# Patient Record
Sex: Female | Born: 1960 | Race: Black or African American | Hispanic: No | Marital: Married | Smoking: Never smoker
Health system: Southern US, Community
[De-identification: ages and names within clinical notes are randomized; demographics above are authoritative.]

## PROBLEM LIST (undated history)

## (undated) DIAGNOSIS — D72819 Decreased white blood cell count, unspecified: Secondary | ICD-10-CM

## (undated) HISTORY — DX: Decreased white blood cell count, unspecified: D72.819

---

## 1993-11-18 HISTORY — PX: OTHER SURGICAL HISTORY: SHX169

## 1998-09-08 ENCOUNTER — Ambulatory Visit: Admission: RE | Admit: 1998-09-08 | Discharge: 1998-09-08 | Payer: Self-pay | Admitting: *Deleted

## 2000-01-07 ENCOUNTER — Other Ambulatory Visit: Admission: RE | Admit: 2000-01-07 | Discharge: 2000-01-07 | Payer: Self-pay | Admitting: Internal Medicine

## 2001-02-16 ENCOUNTER — Other Ambulatory Visit: Admission: RE | Admit: 2001-02-16 | Discharge: 2001-02-16 | Payer: Self-pay | Admitting: Internal Medicine

## 2002-03-08 ENCOUNTER — Other Ambulatory Visit: Admission: RE | Admit: 2002-03-08 | Discharge: 2002-03-08 | Payer: Self-pay | Admitting: Internal Medicine

## 2003-04-20 ENCOUNTER — Encounter: Admission: RE | Admit: 2003-04-20 | Discharge: 2003-04-20 | Payer: Self-pay | Admitting: Internal Medicine

## 2003-04-20 ENCOUNTER — Encounter: Payer: Self-pay | Admitting: Internal Medicine

## 2004-04-23 ENCOUNTER — Encounter: Admission: RE | Admit: 2004-04-23 | Discharge: 2004-04-23 | Payer: Self-pay | Admitting: Internal Medicine

## 2005-05-06 ENCOUNTER — Encounter: Admission: RE | Admit: 2005-05-06 | Discharge: 2005-05-06 | Payer: Self-pay | Admitting: Internal Medicine

## 2006-01-14 ENCOUNTER — Encounter: Admission: RE | Admit: 2006-01-14 | Discharge: 2006-01-14 | Payer: Self-pay | Admitting: Internal Medicine

## 2006-05-07 ENCOUNTER — Encounter: Admission: RE | Admit: 2006-05-07 | Discharge: 2006-05-07 | Payer: Self-pay | Admitting: Internal Medicine

## 2007-05-12 ENCOUNTER — Encounter: Admission: RE | Admit: 2007-05-12 | Discharge: 2007-05-12 | Payer: Self-pay | Admitting: Internal Medicine

## 2008-05-30 ENCOUNTER — Encounter: Admission: RE | Admit: 2008-05-30 | Discharge: 2008-05-30 | Payer: Self-pay | Admitting: Internal Medicine

## 2009-05-27 ENCOUNTER — Emergency Department (HOSPITAL_COMMUNITY): Admission: EM | Admit: 2009-05-27 | Discharge: 2009-05-27 | Payer: Self-pay | Admitting: Family Medicine

## 2009-06-15 ENCOUNTER — Encounter: Admission: RE | Admit: 2009-06-15 | Discharge: 2009-06-15 | Payer: Self-pay | Admitting: Internal Medicine

## 2010-06-28 ENCOUNTER — Encounter: Admission: RE | Admit: 2010-06-28 | Discharge: 2010-06-28 | Payer: Self-pay | Admitting: Internal Medicine

## 2011-05-30 ENCOUNTER — Other Ambulatory Visit: Payer: Self-pay | Admitting: Internal Medicine

## 2011-05-30 DIAGNOSIS — Z1231 Encounter for screening mammogram for malignant neoplasm of breast: Secondary | ICD-10-CM

## 2011-07-10 ENCOUNTER — Ambulatory Visit
Admission: RE | Admit: 2011-07-10 | Discharge: 2011-07-10 | Disposition: A | Discharge status: Home / Self Care | Payer: 59 | Source: Ambulatory Visit | Attending: Internal Medicine | Admitting: Internal Medicine

## 2011-07-10 DIAGNOSIS — Z1231 Encounter for screening mammogram for malignant neoplasm of breast: Secondary | ICD-10-CM

## 2012-06-08 ENCOUNTER — Other Ambulatory Visit: Payer: Self-pay | Admitting: Internal Medicine

## 2012-06-08 DIAGNOSIS — Z1231 Encounter for screening mammogram for malignant neoplasm of breast: Secondary | ICD-10-CM

## 2012-07-14 ENCOUNTER — Ambulatory Visit
Admission: RE | Admit: 2012-07-14 | Discharge: 2012-07-14 | Disposition: A | Discharge status: Home / Self Care | Payer: 59 | Source: Ambulatory Visit | Attending: Internal Medicine | Admitting: Internal Medicine

## 2012-07-14 DIAGNOSIS — Z1231 Encounter for screening mammogram for malignant neoplasm of breast: Secondary | ICD-10-CM

## 2013-06-25 ENCOUNTER — Other Ambulatory Visit: Payer: Self-pay

## 2013-06-25 DIAGNOSIS — Z1231 Encounter for screening mammogram for malignant neoplasm of breast: Secondary | ICD-10-CM

## 2013-07-22 ENCOUNTER — Ambulatory Visit
Admission: RE | Admit: 2013-07-22 | Discharge: 2013-07-22 | Disposition: A | Discharge status: Home / Self Care | Payer: 59 | Source: Ambulatory Visit

## 2013-07-22 DIAGNOSIS — Z1231 Encounter for screening mammogram for malignant neoplasm of breast: Secondary | ICD-10-CM

## 2014-07-07 ENCOUNTER — Other Ambulatory Visit: Payer: Self-pay

## 2014-07-07 DIAGNOSIS — Z1231 Encounter for screening mammogram for malignant neoplasm of breast: Secondary | ICD-10-CM

## 2014-08-04 ENCOUNTER — Ambulatory Visit
Admission: RE | Admit: 2014-08-04 | Discharge: 2014-08-04 | Disposition: A | Discharge status: Home / Self Care | Payer: 59 | Source: Ambulatory Visit

## 2014-08-04 DIAGNOSIS — Z1231 Encounter for screening mammogram for malignant neoplasm of breast: Secondary | ICD-10-CM

## 2014-11-01 LAB — HM COLONOSCOPY

## 2015-07-25 ENCOUNTER — Other Ambulatory Visit: Payer: Self-pay

## 2015-07-25 DIAGNOSIS — Z1231 Encounter for screening mammogram for malignant neoplasm of breast: Secondary | ICD-10-CM

## 2015-08-07 ENCOUNTER — Ambulatory Visit
Admission: RE | Admit: 2015-08-07 | Discharge: 2015-08-07 | Disposition: A | Discharge status: Home / Self Care | Payer: 59 | Source: Ambulatory Visit

## 2015-08-07 DIAGNOSIS — Z1231 Encounter for screening mammogram for malignant neoplasm of breast: Secondary | ICD-10-CM

## 2016-03-11 DIAGNOSIS — H524 Presbyopia: Secondary | ICD-10-CM | POA: Diagnosis not present

## 2016-04-02 DIAGNOSIS — H9312 Tinnitus, left ear: Secondary | ICD-10-CM | POA: Diagnosis not present

## 2016-04-02 DIAGNOSIS — H9042 Sensorineural hearing loss, unilateral, left ear, with unrestricted hearing on the contralateral side: Secondary | ICD-10-CM | POA: Diagnosis not present

## 2016-06-26 MED FILL — FLUOCINOLONE 0.01% SCALP OI: 0.01 | 30 days supply | Qty: 118 | Fill #1

## 2016-07-01 ENCOUNTER — Other Ambulatory Visit: Payer: Self-pay | Admitting: Nurse Practitioner

## 2016-07-01 ENCOUNTER — Other Ambulatory Visit: Payer: Self-pay | Admitting: Internal Medicine

## 2016-07-01 DIAGNOSIS — Z1231 Encounter for screening mammogram for malignant neoplasm of breast: Secondary | ICD-10-CM

## 2016-07-12 ENCOUNTER — Ambulatory Visit: Payer: 59

## 2016-08-12 ENCOUNTER — Ambulatory Visit
Admission: RE | Admit: 2016-08-12 | Discharge: 2016-08-12 | Disposition: A | Discharge status: Home / Self Care | Payer: 59 | Source: Ambulatory Visit | Attending: Internal Medicine | Admitting: Internal Medicine

## 2016-08-12 DIAGNOSIS — Z1231 Encounter for screening mammogram for malignant neoplasm of breast: Secondary | ICD-10-CM | POA: Diagnosis not present

## 2016-09-26 DIAGNOSIS — Z6823 Body mass index (BMI) 23.0-23.9, adult: Secondary | ICD-10-CM | POA: Diagnosis not present

## 2016-09-26 DIAGNOSIS — Z0000 Encounter for general adult medical examination without abnormal findings: Secondary | ICD-10-CM | POA: Diagnosis not present

## 2016-09-26 MED FILL — NYSTATIN 100,000 UNIT/GM PO: 100000 | 30 days supply | Qty: 60 | Fill #0

## 2016-12-23 DIAGNOSIS — H5203 Hypermetropia, bilateral: Secondary | ICD-10-CM | POA: Diagnosis not present

## 2016-12-23 DIAGNOSIS — H524 Presbyopia: Secondary | ICD-10-CM | POA: Diagnosis not present

## 2016-12-23 DIAGNOSIS — H18413 Arcus senilis, bilateral: Secondary | ICD-10-CM | POA: Diagnosis not present

## 2016-12-23 DIAGNOSIS — H1045 Other chronic allergic conjunctivitis: Secondary | ICD-10-CM | POA: Diagnosis not present

## 2016-12-23 DIAGNOSIS — H52221 Regular astigmatism, right eye: Secondary | ICD-10-CM | POA: Diagnosis not present

## 2017-01-07 DIAGNOSIS — K6289 Other specified diseases of anus and rectum: Secondary | ICD-10-CM | POA: Diagnosis not present

## 2017-02-18 DIAGNOSIS — K602 Anal fissure, unspecified: Secondary | ICD-10-CM | POA: Diagnosis not present

## 2017-02-18 DIAGNOSIS — K6289 Other specified diseases of anus and rectum: Secondary | ICD-10-CM | POA: Diagnosis not present

## 2017-03-28 MED FILL — AMOXICILLIN 250 MG/5 ML SUS: 250 | 10 days supply | Qty: 400 | Fill #0

## 2017-07-08 ENCOUNTER — Other Ambulatory Visit: Payer: Self-pay | Admitting: Internal Medicine

## 2017-07-08 DIAGNOSIS — Z1231 Encounter for screening mammogram for malignant neoplasm of breast: Secondary | ICD-10-CM

## 2017-08-18 ENCOUNTER — Ambulatory Visit
Admission: RE | Admit: 2017-08-18 | Discharge: 2017-08-18 | Disposition: A | Discharge status: Home / Self Care | Payer: 59 | Source: Ambulatory Visit | Attending: Internal Medicine | Admitting: Internal Medicine

## 2017-08-18 DIAGNOSIS — Z1231 Encounter for screening mammogram for malignant neoplasm of breast: Secondary | ICD-10-CM | POA: Diagnosis not present

## 2017-09-01 MED FILL — FLUOCINOLONE 0.01% SCALP OI: 0.01 | 25 days supply | Qty: 118 | Fill #0

## 2017-10-29 DIAGNOSIS — K602 Anal fissure, unspecified: Secondary | ICD-10-CM | POA: Diagnosis not present

## 2017-10-30 DIAGNOSIS — Z0000 Encounter for general adult medical examination without abnormal findings: Secondary | ICD-10-CM | POA: Diagnosis not present

## 2018-02-09 DIAGNOSIS — R21 Rash and other nonspecific skin eruption: Secondary | ICD-10-CM | POA: Diagnosis not present

## 2018-02-09 MED FILL — CLOBETASOL 0.05% CREAM: 0.05 | 20 days supply | Qty: 30 | Fill #0

## 2018-03-31 MED FILL — PREVIDENT 5000 BOOSTER PLUS: 1.1 | 30 days supply | Qty: 100 | Fill #0

## 2018-04-04 DIAGNOSIS — H524 Presbyopia: Secondary | ICD-10-CM | POA: Diagnosis not present

## 2018-08-04 ENCOUNTER — Other Ambulatory Visit: Payer: Self-pay | Admitting: Internal Medicine

## 2018-08-04 DIAGNOSIS — Z1231 Encounter for screening mammogram for malignant neoplasm of breast: Secondary | ICD-10-CM

## 2018-09-01 ENCOUNTER — Ambulatory Visit
Admission: RE | Admit: 2018-09-01 | Discharge: 2018-09-01 | Disposition: A | Discharge status: Home / Self Care | Payer: 59 | Source: Ambulatory Visit | Attending: Internal Medicine | Admitting: Internal Medicine

## 2018-09-01 DIAGNOSIS — Z1231 Encounter for screening mammogram for malignant neoplasm of breast: Secondary | ICD-10-CM

## 2018-11-30 ENCOUNTER — Encounter: Payer: Self-pay | Admitting: Internal Medicine

## 2018-11-30 ENCOUNTER — Other Ambulatory Visit (HOSPITAL_COMMUNITY)
Admission: RE | Admit: 2018-11-30 | Discharge: 2018-11-30 | Disposition: A | Discharge status: Home / Self Care | Payer: 59 | Source: Ambulatory Visit | Attending: Internal Medicine | Admitting: Internal Medicine

## 2018-11-30 ENCOUNTER — Ambulatory Visit: Payer: 59 | Admitting: Internal Medicine

## 2018-11-30 VITALS — BP 112/74 | HR 76 | Temp 98.0°F | Ht 68.58 in | Wt 144.0 lb

## 2018-11-30 DIAGNOSIS — Z01419 Encounter for gynecological examination (general) (routine) without abnormal findings: Secondary | ICD-10-CM | POA: Diagnosis not present

## 2018-11-30 DIAGNOSIS — Z0000 Encounter for general adult medical examination without abnormal findings: Secondary | ICD-10-CM | POA: Diagnosis not present

## 2018-11-30 DIAGNOSIS — Z86010 Personal history of colonic polyps: Secondary | ICD-10-CM | POA: Insufficient documentation

## 2018-11-30 LAB — POCT URINALYSIS DIPSTICK
Bilirubin, UA: NEGATIVE
GLUCOSE UA: NEGATIVE
KETONES UA: NEGATIVE
LEUKOCYTES UA: NEGATIVE
NITRITE UA: NEGATIVE
PROTEIN UA: NEGATIVE
RBC UA: NEGATIVE
SPEC GRAV UA: 1.01 (ref 1.010–1.025)
Urobilinogen, UA: 0.2 E.U./dL
pH, UA: 7.5 (ref 5.0–8.0)

## 2018-11-30 LAB — CMP14+EGFR
A/G RATIO: 1.6 (ref 1.2–2.2)
ALBUMIN: 4.4 g/dL (ref 3.5–5.5)
ALT: 17 IU/L (ref 0–32)
AST: 21 IU/L (ref 0–40)
Alkaline Phosphatase: 94 IU/L (ref 39–117)
BUN / CREAT RATIO: 8 — AB (ref 9–23)
BUN: 7 mg/dL (ref 6–24)
Bilirubin Total: 0.3 mg/dL (ref 0.0–1.2)
CALCIUM: 9.9 mg/dL (ref 8.7–10.2)
CO2: 26 mmol/L (ref 20–29)
CREATININE: 0.92 mg/dL (ref 0.57–1.00)
Chloride: 102 mmol/L (ref 96–106)
GFR calc non Af Amer: 70 mL/min/{1.73_m2} (ref >59)
GFR, EST AFRICAN AMERICAN: 81 mL/min/{1.73_m2} (ref >59)
Globulin, Total: 2.7 g/dL (ref 1.5–4.5)
Glucose: 77 mg/dL (ref 65–99)
POTASSIUM: 4.4 mmol/L (ref 3.5–5.2)
SODIUM: 141 mmol/L (ref 134–144)
TOTAL PROTEIN: 7.1 g/dL (ref 6.0–8.5)

## 2018-11-30 LAB — CBC
Hematocrit: 40 % (ref 34.0–46.6)
Hemoglobin: 13.5 g/dL (ref 11.1–15.9)
MCH: 30.6 pg (ref 26.6–33.0)
MCHC: 33.8 g/dL (ref 31.5–35.7)
MCV: 91 fL (ref 79–97)
PLATELETS: 238 10*3/uL (ref 150–450)
RBC: 4.41 x10E6/uL (ref 3.77–5.28)
RDW: 11.6 % — ABNORMAL LOW (ref 11.7–15.4)
WBC: 3.6 10*3/uL (ref 3.4–10.8)

## 2018-11-30 LAB — LIPID PANEL
CHOLESTEROL TOTAL: 194 mg/dL (ref 100–199)
Chol/HDL Ratio: 2.5 ratio (ref 0.0–4.4)
HDL: 79 mg/dL (ref >39)
LDL Calculated: 101 mg/dL — ABNORMAL HIGH (ref 0–99)
Triglycerides: 70 mg/dL (ref 0–149)
VLDL Cholesterol Cal: 14 mg/dL (ref 5–40)

## 2018-11-30 LAB — HEMOGLOBIN A1C
ESTIMATED AVERAGE GLUCOSE: 105 mg/dL
HEMOGLOBIN A1C: 5.3 % (ref 4.8–5.6)

## 2018-11-30 NOTE — Progress Notes (Signed)
Subjective:     Patient ID: Donna Ayers , female    DOB: Aug 26, 1961 , 58 y.o.   MRN: [id number]   Chief Complaint  Patient presents with  . Annual Exam    HPI  She is here today for a full physical examination. She would like to have a pap smear today. She reports being up to date with mammograms. She thinks she received her last tetanus shot at work. She has no specific concerns or complaints at this time.     History reviewed. No pertinent past medical history.   Family History  Problem Relation Age of Onset  . Diabetes Mother   . Hypertension Father   . Breast cancer Neg Hx      Current Outpatient Medications:  .  Cholecalciferol (VITAMIN D3) 50 MCG (2000 UT) capsule, Take 2,000 Units by mouth daily., Disp: , Rfl:    No Known Allergies    No LMP recorded. Patient is postmenopausal..  Negative for: breast discharge, breast lump(s), breast pain and breast self exam. Associated symptoms include abnormal vaginal bleeding. Pertinent negatives include abnormal bleeding (hematology), anxiety, decreased libido, depression, difficulty falling sleep, dyspareunia, history of infertility, nocturia, sexual dysfunction, sleep disturbances, urinary incontinence, urinary urgency, vaginal discharge and vaginal itching. Diet regular.The patient states her exercise level is  VIGOROUS.  . The patient's tobacco use is:  Social History   Tobacco Use  Smoking Status Never Smoker  Smokeless Tobacco Never Used  . She has been exposed to passive smoke. The patient's alcohol use is:  Social History   Substance and Sexual Activity  Alcohol Use Never  . Frequency: Never  . Additional information: Last pap 2017, next one scheduled for TODAY. Review of Systems  Constitutional: Negative.   HENT: Negative.   Eyes: Negative.   Respiratory: Negative.   Cardiovascular: Negative.   Gastrointestinal: Negative.   Endocrine: Negative.   Genitourinary: Negative.   Musculoskeletal: Negative.    Skin: Negative.   Allergic/Immunologic: Negative.   Neurological: Negative.   Hematological: Negative.   Psychiatric/Behavioral: Negative.      Today's Vitals   11/30/18 0942  BP: 112/74  Pulse: 76  Temp: 98 F (36.7 C)  TempSrc: Oral  Weight: 144 lb (65.3 kg)  Height: 5' 8.58" (1.742 m)   Body mass index is 21.53 kg/m.   Objective:  Physical Exam Vitals signs and nursing note reviewed. Exam conducted with a chaperone present.  Constitutional:      Appearance: Normal appearance. She is normal weight.  HENT:     Head: Normocephalic and atraumatic.     Right Ear: Tympanic membrane, ear canal and external ear normal.     Left Ear: Tympanic membrane, ear canal and external ear normal.     Nose: Nose normal.     Mouth/Throat:     Mouth: Mucous membranes are moist.     Pharynx: Oropharynx is clear.  Eyes:     Extraocular Movements: Extraocular movements intact.     Conjunctiva/sclera: Conjunctivae normal.     Pupils: Pupils are equal, round, and reactive to light.  Neck:     Musculoskeletal: Normal range of motion.  Cardiovascular:     Rate and Rhythm: Normal rate and regular rhythm.     Pulses: Normal pulses.     Heart sounds: Normal heart sounds.  Pulmonary:     Effort: Pulmonary effort is normal.     Breath sounds: Normal breath sounds.  Chest:     Breasts:  Right: Normal. No swelling, bleeding, inverted nipple, mass or nipple discharge.        Left: Normal. No swelling, bleeding, inverted nipple, mass or nipple discharge.  Abdominal:     General: Abdomen is flat. Bowel sounds are normal.     Palpations: Abdomen is soft.  Genitourinary:    General: Normal vulva.     Exam position: Lithotomy position.     Labia:        Right: No rash or tenderness.        Left: No rash or tenderness.      Vagina: Normal.     Cervix: Normal.     Uterus: Normal.      Adnexa: Right adnexa normal and left adnexa normal.  Musculoskeletal: Normal range of motion.  Skin:     General: Skin is warm and dry.     Capillary Refill: Capillary refill takes less than 2 seconds.  Neurological:     General: No focal deficit present.     Mental Status: She is alert and oriented to person, place, and time.  Psychiatric:        Mood and Affect: Mood normal.        Behavior: Behavior normal.         Assessment And Plan:     1. Routine general medical examination at health care facility  A full exam was performed.  Importance of monthly self breast exams was discussed with the patient. PATIENT HAS BEEN ADVISED TO GET 30-45 MINUTES REGULAR EXERCISE NO LESS THAN FOUR TO FIVE DAYS PER WEEK - BOTH WEIGHTBEARING EXERCISES AND AEROBIC ARE RECOMMENDED.  SHE IS ADVISED TO FOLLOW A HEALTHY DIET WITH AT LEAST SIX FRUITS/VEGGIES PER DAY, DECREASE INTAKE OF RED MEAT, AND TO INCREASE FISH INTAKE TO TWO DAYS PER WEEK.  MEATS/FISH SHOULD NOT BE FRIED, BAKED OR BROILED IS PREFERABLE.  I SUGGEST WEARING SPF 50 SUNSCREEN ON EXPOSED PARTS AND ESPECIALLY WHEN IN THE DIRECT SUNLIGHT FOR AN EXTENDED PERIOD OF TIME.  PLEASE AVOID FAST FOOD RESTAURANTS AND INCREASE YOUR WATER INTAKE.  - CMP14+EGFR - CBC - Lipid panel - Hemoglobin A1c - POCT Urinalysis Dipstick (81002)  2. Cervical smear, as part of routine gynecological examination  Pap smear was performed. She refused rectal exam.  Importance of monthly self breast exams was discussed with the patient.   - Cytology -Pap Smear        Maximino Greenland, MD pap

## 2018-11-30 NOTE — Patient Instructions (Signed)

## 2018-12-01 NOTE — Progress Notes (Signed)
Here are your lab results:  Your liver and kidney function are normal.  Your blood count is normal.  Your LDL, bad cholesterol is 101. Ideally, this should be 100 or less, you are close! Continue with your healthy lifestyle and continue to exercise five days per week.   You are not prediabetic. Please let me know if you have any other questions.   Take care,   RS

## 2018-12-02 LAB — CYTOLOGY - PAP
Diagnosis: NEGATIVE
HPV: NOT DETECTED

## 2018-12-18 ENCOUNTER — Ambulatory Visit: Payer: 59

## 2018-12-18 VITALS — BP 112/70 | HR 67 | Temp 98.0°F | Ht 68.5 in

## 2018-12-18 DIAGNOSIS — Z23 Encounter for immunization: Secondary | ICD-10-CM

## 2018-12-18 MED ORDER — TETANUS-DIPHTH-ACELL PERTUSSIS 5-2.5-18.5 LF-MCG/0.5 IM SUSP
0.5000 mL | Freq: Once | INTRAMUSCULAR | Status: AC
  Administered 2018-12-18: 0.5 mL via INTRAMUSCULAR

## 2019-03-08 ENCOUNTER — Other Ambulatory Visit: Payer: Self-pay | Admitting: Internal Medicine

## 2019-03-08 ENCOUNTER — Other Ambulatory Visit: Payer: Self-pay

## 2019-03-08 ENCOUNTER — Encounter: Payer: Self-pay | Admitting: Nurse Practitioner

## 2019-03-08 ENCOUNTER — Ambulatory Visit: Payer: 59 | Admitting: Nurse Practitioner

## 2019-03-08 VITALS — BP 128/70 | HR 75 | Temp 98.3°F | Ht 66.2 in | Wt 144.2 lb

## 2019-03-08 DIAGNOSIS — R591 Generalized enlarged lymph nodes: Secondary | ICD-10-CM | POA: Insufficient documentation

## 2019-03-08 MED ORDER — AMOXICILLIN 400 MG/5ML PO SUSR: ORAL | 0 refills | Status: DC

## 2019-03-08 MED ORDER — AMOXICILLIN 875 MG PO TABS: 875.0000 mg | ORAL_TABLET | Freq: Two times a day (BID) | ORAL | 0 refills | Status: DC

## 2019-03-08 MED FILL — AMOXICILLIN 400 MG/5 ML SUS: 400 | 5 days supply | Qty: 100 | Fill #0

## 2019-03-08 MED FILL — FLUOCINOLONE ACETONIDE SCAL: 0.01 | 30 days supply | Qty: 118 | Fill #0

## 2019-03-08 NOTE — Progress Notes (Signed)
  Subjective:     Patient ID: Donna Ayers , female    DOB: May 07, 1961 , 58 y.o.   MRN: [id number]   Chief Complaint  Patient presents with  . Cyst    HPI  Bump behind left ear - has been present for 11 days.  Denies fever.  Denies pain.  She took cough medication to see if effective.  No antihistamines.      No past medical history on file.   Family History  Problem Relation Age of Onset  . Diabetes Mother   . Hypertension Father   . Breast cancer Neg Hx      Current Outpatient Medications:  .  Cholecalciferol (VITAMIN D3) 50 MCG (2000 UT) capsule, Take 2,000 Units by mouth daily., Disp: , Rfl:  .  Multiple Vitamins-Minerals (MULTIVITAL) CHEW, Chew by mouth., Disp: , Rfl:    No Known Allergies   Review of Systems  Constitutional: Negative for fatigue and fever.  HENT: Negative.  Negative for sore throat.   Musculoskeletal: Negative for neck pain.       Knot below left ear.  Neurological: Negative.  Negative for dizziness and headaches.  Psychiatric/Behavioral: Negative.      Today's Vitals   03/08/19 0849  BP: 128/70  Pulse: 75  Temp: 98.3 F (36.8 C)  TempSrc: Oral  Weight: 144 lb 3.2 oz (65.4 kg)  Height: 5' 6.2" (1.681 m)   Body mass index is 23.13 kg/m.   Objective:  Physical Exam Constitutional:      Appearance: Normal appearance.  HENT:     Head: Normocephalic.     Right Ear: Tympanic membrane, ear canal and external ear normal. There is no impacted cerumen.     Left Ear: Tympanic membrane, ear canal and external ear normal. There is no impacted cerumen.     Mouth/Throat:     Palate: Mass (left submandibular area) present. No lesions.  Eyes:     Pupils: Pupils are equal, round, and reactive to light.  Cardiovascular:     Rate and Rhythm: Normal rate and regular rhythm.     Pulses: Normal pulses.     Heart sounds: No murmur.  Skin:    Capillary Refill: Capillary refill takes less than 2 seconds.  Neurological:     General: No focal  deficit present.     Mental Status: She is alert and oriented to person, place, and time.  Psychiatric:        Mood and Affect: Mood normal.        Behavior: Behavior normal.        Thought Content: Thought content normal.        Judgment: Judgment normal.         Assessment And Plan:     1. Lymphadenopathy  Firm mass to left subclavicular area nontender  Will treat with antibiotic to see if improves  Also will check neck ultrasound to evaluate mass  Lymphadenopathy of unknown origin vs viral vs seasonal allergies - CBC with Diff - HIV antibody (with reflex) - US Soft Tissue Head/Neck; Future - amoxicillin (AMOXIL) 400 MG/5ML suspension; Take 10 ml by mouth BID  Dispense: 100 mL; Refill: 0      Arnette Felts, FNP    THE PATIENT IS ENCOURAGED TO PRACTICE SOCIAL DISTANCING DUE TO THE COVID-19 PANDEMIC.

## 2019-03-09 LAB — CBC WITH DIFFERENTIAL/PLATELET
Basophils Absolute: 0 10*3/uL (ref 0.0–0.2)
Basos: 0 %
EOS (ABSOLUTE): 0 10*3/uL (ref 0.0–0.4)
Eos: 1 %
Hematocrit: 42.8 % (ref 34.0–46.6)
Hemoglobin: 13.7 g/dL (ref 11.1–15.9)
Immature Grans (Abs): 0 10*3/uL (ref 0.0–0.1)
Immature Granulocytes: 0 %
Lymphocytes Absolute: 1.3 10*3/uL (ref 0.7–3.1)
Lymphs: 47 %
MCH: 29.7 pg (ref 26.6–33.0)
MCHC: 32 g/dL (ref 31.5–35.7)
MCV: 93 fL (ref 79–97)
Monocytes Absolute: 0.3 10*3/uL (ref 0.1–0.9)
Monocytes: 12 %
Neutrophils Absolute: 1.1 10*3/uL — ABNORMAL LOW (ref 1.4–7.0)
Neutrophils: 40 %
Platelets: 238 10*3/uL (ref 150–450)
RBC: 4.61 x10E6/uL (ref 3.77–5.28)
RDW: 12 % (ref 11.7–15.4)
WBC: 2.7 10*3/uL — ABNORMAL LOW (ref 3.4–10.8)

## 2019-03-09 LAB — HIV ANTIBODY (ROUTINE TESTING W REFLEX): HIV Screen 4th Generation wRfx: NONREACTIVE

## 2019-03-11 ENCOUNTER — Telehealth: Payer: Self-pay

## 2019-03-11 NOTE — Telephone Encounter (Signed)
1st attempt to give lab results  

## 2019-03-11 NOTE — Telephone Encounter (Signed)
-----   Message from Arnette Felts, FNP sent at 03/10/2019  5:32 PM EDT ----- Your white count is slightly low indicative you may have a virus.  Have you been for the ultrasound? Is the area any better?

## 2019-03-30 ENCOUNTER — Other Ambulatory Visit: Payer: Self-pay | Admitting: Nurse Practitioner

## 2019-03-30 ENCOUNTER — Other Ambulatory Visit: Payer: 59

## 2019-03-30 ENCOUNTER — Ambulatory Visit
Admission: RE | Admit: 2019-03-30 | Discharge: 2019-03-30 | Disposition: A | Discharge status: Home / Self Care | Payer: 59 | Source: Ambulatory Visit | Attending: Nurse Practitioner | Admitting: Nurse Practitioner

## 2019-03-30 DIAGNOSIS — R591 Generalized enlarged lymph nodes: Secondary | ICD-10-CM | POA: Diagnosis not present

## 2019-03-30 DIAGNOSIS — L049 Acute lymphadenitis, unspecified: Secondary | ICD-10-CM

## 2019-03-31 NOTE — Progress Notes (Signed)
Yes, they will call her

## 2019-04-13 ENCOUNTER — Ambulatory Visit
Admission: RE | Admit: 2019-04-13 | Discharge: 2019-04-13 | Disposition: A | Discharge status: Home / Self Care | Payer: 59 | Source: Ambulatory Visit | Attending: Nurse Practitioner | Admitting: Nurse Practitioner

## 2019-04-13 DIAGNOSIS — L049 Acute lymphadenitis, unspecified: Secondary | ICD-10-CM | POA: Diagnosis not present

## 2019-04-13 MED ORDER — IOPAMIDOL (ISOVUE-300) INJECTION 61%
75.0000 mL | Freq: Once | INTRAVENOUS | Status: AC | PRN
  Administered 2019-04-13: 12:00:00 75 mL via INTRAVENOUS

## 2019-04-16 ENCOUNTER — Telehealth: Payer: Self-pay

## 2019-04-16 NOTE — Telephone Encounter (Signed)
I called pt to see what time would be best for Arnette Felts FNP-BC to call her regarding her results pt stated she goes to lunch at 12:30 I notified her Lolita Cram will be contacting her at 12:45 [facility]

## 2019-04-26 ENCOUNTER — Other Ambulatory Visit: Payer: Self-pay | Admitting: Nurse Practitioner

## 2019-04-26 DIAGNOSIS — R591 Generalized enlarged lymph nodes: Secondary | ICD-10-CM

## 2019-05-27 ENCOUNTER — Telehealth: Payer: Self-pay

## 2019-05-27 NOTE — Telephone Encounter (Signed)
Patient called stating she still hasnt received a call from the ENT specialist. I have left pt v/m to notify her that they should be contacting her. YRL,RMA

## 2019-06-08 DIAGNOSIS — R591 Generalized enlarged lymph nodes: Secondary | ICD-10-CM | POA: Diagnosis not present

## 2019-06-14 ENCOUNTER — Other Ambulatory Visit: Payer: Self-pay | Admitting: Otolaryngology

## 2019-06-14 DIAGNOSIS — R591 Generalized enlarged lymph nodes: Secondary | ICD-10-CM

## 2019-06-16 ENCOUNTER — Other Ambulatory Visit: Payer: Self-pay | Admitting: Otolaryngology

## 2019-06-16 DIAGNOSIS — R591 Generalized enlarged lymph nodes: Secondary | ICD-10-CM

## 2019-07-06 ENCOUNTER — Ambulatory Visit
Admission: RE | Admit: 2019-07-06 | Discharge: 2019-07-06 | Disposition: A | Discharge status: Home / Self Care | Payer: 59 | Source: Ambulatory Visit | Attending: Otolaryngology | Admitting: Otolaryngology

## 2019-07-06 DIAGNOSIS — R591 Generalized enlarged lymph nodes: Secondary | ICD-10-CM

## 2019-07-06 DIAGNOSIS — R590 Localized enlarged lymph nodes: Secondary | ICD-10-CM | POA: Diagnosis not present

## 2019-07-20 ENCOUNTER — Other Ambulatory Visit: Payer: Self-pay | Admitting: Internal Medicine

## 2019-07-20 DIAGNOSIS — Z1231 Encounter for screening mammogram for malignant neoplasm of breast: Secondary | ICD-10-CM

## 2019-08-17 DIAGNOSIS — H524 Presbyopia: Secondary | ICD-10-CM | POA: Diagnosis not present

## 2019-09-14 ENCOUNTER — Ambulatory Visit
Admission: RE | Admit: 2019-09-14 | Discharge: 2019-09-14 | Disposition: A | Discharge status: Home / Self Care | Payer: 59 | Source: Ambulatory Visit | Attending: Internal Medicine | Admitting: Internal Medicine

## 2019-09-14 ENCOUNTER — Other Ambulatory Visit: Payer: Self-pay

## 2019-09-14 DIAGNOSIS — Z1231 Encounter for screening mammogram for malignant neoplasm of breast: Secondary | ICD-10-CM | POA: Diagnosis not present

## 2019-12-07 ENCOUNTER — Ambulatory Visit: Payer: 59 | Admitting: Internal Medicine

## 2019-12-07 ENCOUNTER — Encounter: Payer: Self-pay | Admitting: Internal Medicine

## 2019-12-07 ENCOUNTER — Other Ambulatory Visit: Payer: Self-pay

## 2019-12-07 VITALS — BP 112/70 | HR 74 | Temp 98.3°F | Ht 66.2 in | Wt 150.8 lb

## 2019-12-07 DIAGNOSIS — R635 Abnormal weight gain: Secondary | ICD-10-CM | POA: Diagnosis not present

## 2019-12-07 DIAGNOSIS — Z0000 Encounter for general adult medical examination without abnormal findings: Secondary | ICD-10-CM | POA: Diagnosis not present

## 2019-12-07 DIAGNOSIS — Z780 Asymptomatic menopausal state: Secondary | ICD-10-CM

## 2019-12-07 LAB — POCT URINALYSIS DIPSTICK
Bilirubin, UA: NEGATIVE
Glucose, UA: NEGATIVE
Ketones, UA: NEGATIVE
Leukocytes, UA: NEGATIVE
Nitrite, UA: NEGATIVE
Protein, UA: NEGATIVE
Spec Grav, UA: 1.015 (ref 1.010–1.025)
Urobilinogen, UA: 0.2 E.U./dL
pH, UA: 7 (ref 5.0–8.0)

## 2019-12-07 NOTE — Patient Instructions (Signed)
Health Maintenance, Female Adopting a healthy lifestyle and getting preventive care are important in promoting health and wellness. Ask your health care provider about:  The right schedule for you to have regular tests and exams.  Things you can do on your own to prevent diseases and keep yourself healthy. What should I know about diet, weight, and exercise? Eat a healthy diet   Eat a diet that includes plenty of vegetables, fruits, low-fat dairy products, and lean protein.  Do not eat a lot of foods that are high in solid fats, added sugars, or sodium. Maintain a healthy weight Body mass index (BMI) is used to identify weight problems. It estimates body fat based on height and weight. Your health care provider can help determine your BMI and help you achieve or maintain a healthy weight. Get regular exercise Get regular exercise. This is one of the most important things you can do for your health. Most adults should:  Exercise for at least 150 minutes each week. The exercise should increase your heart rate and make you sweat (moderate-intensity exercise).  Do strengthening exercises at least twice a week. This is in addition to the moderate-intensity exercise.  Spend less time sitting. Even light physical activity can be beneficial. Watch cholesterol and blood lipids Have your blood tested for lipids and cholesterol at 57 years of age, then have this test every 5 years. Have your cholesterol levels checked more often if:  Your lipid or cholesterol levels are high.  You are older than 57 years of age.  You are at high risk for heart disease. What should I know about cancer screening? Depending on your health history and family history, you may need to have cancer screening at various ages. This may include screening for:  Breast cancer.  Cervical cancer.  Colorectal cancer.  Skin cancer.  Lung cancer. What should I know about heart disease, diabetes, and high blood  pressure? Blood pressure and heart disease  High blood pressure causes heart disease and increases the risk of stroke. This is more likely to develop in people who have high blood pressure readings, are of African descent, or are overweight.  Have your blood pressure checked: ? Every 3-5 years if you are 18-39 years of age. ? Every year if you are 40 years old or older. Diabetes Have regular diabetes screenings. This checks your fasting blood sugar level. Have the screening done:  Once every three years after age 40 if you are at a normal weight and have a low risk for diabetes.  More often and at a younger age if you are overweight or have a high risk for diabetes. What should I know about preventing infection? Hepatitis B If you have a higher risk for hepatitis B, you should be screened for this virus. Talk with your health care provider to find out if you are at risk for hepatitis B infection. Hepatitis C Testing is recommended for:  Everyone born from 1945 through 1965.  Anyone with known risk factors for hepatitis C. Sexually transmitted infections (STIs)  Get screened for STIs, including gonorrhea and chlamydia, if: ? You are sexually active and are younger than 57 years of age. ? You are older than 57 years of age and your health care provider tells you that you are at risk for this type of infection. ? Your sexual activity has changed since you were last screened, and you are at increased risk for chlamydia or gonorrhea. Ask your health care provider if   you are at risk.  Ask your health care provider about whether you are at high risk for HIV. Your health care provider may recommend a prescription medicine to help prevent HIV infection. If you choose to take medicine to prevent HIV, you should first get tested for HIV. You should then be tested every 3 months for as long as you are taking the medicine. Pregnancy  If you are about to stop having your period (premenopausal) and  you may become pregnant, seek counseling before you get pregnant.  Take 400 to 800 micrograms (mcg) of folic acid every day if you become pregnant.  Ask for birth control (contraception) if you want to prevent pregnancy. Osteoporosis and menopause Osteoporosis is a disease in which the bones lose minerals and strength with aging. This can result in bone fractures. If you are 65 years old or older, or if you are at risk for osteoporosis and fractures, ask your health care provider if you should:  Be screened for bone loss.  Take a calcium or vitamin D supplement to lower your risk of fractures.  Be given hormone replacement therapy (HRT) to treat symptoms of menopause. Follow these instructions at home: Lifestyle  Do not use any products that contain nicotine or tobacco, such as cigarettes, e-cigarettes, and chewing tobacco. If you need help quitting, ask your health care provider.  Do not use street drugs.  Do not share needles.  Ask your health care provider for help if you need support or information about quitting drugs. Alcohol use  Do not drink alcohol if: ? Your health care provider tells you not to drink. ? You are pregnant, may be pregnant, or are planning to become pregnant.  If you drink alcohol: ? Limit how much you use to 0-1 drink a day. ? Limit intake if you are breastfeeding.  Be aware of how much alcohol is in your drink. In the [place] one drink equals one 12 oz bottle of beer (355 mL), one 5 oz glass of wine (148 mL), or one 1 oz glass of hard liquor (44 mL). General instructions  Schedule regular health, dental, and eye exams.  Stay current with your vaccines.  Tell your health care provider if: ? You often feel depressed. ? You have ever been abused or do not feel safe at home. Summary  Adopting a healthy lifestyle and getting preventive care are important in promoting health and wellness.  Follow your health care provider's instructions about healthy  diet, exercising, and getting tested or screened for diseases.  Follow your health care provider's instructions on monitoring your cholesterol and blood pressure. This information is not intended to replace advice given to you by your health care provider. Make sure you discuss any questions you have with your health care provider. Document Revised: 10/28/2018 Document Reviewed: 10/28/2018 Elsevier Patient Education  2020 [employer]  

## 2019-12-07 NOTE — Addendum Note (Signed)
Addended by: Mariam Dollar on: 12/07/2019 12:20 PM   Modules accepted: Orders

## 2019-12-07 NOTE — Progress Notes (Signed)
This visit occurred during the SARS-CoV-2 public health emergency.  Safety protocols were in place, including screening questions prior to the visit, additional usage of staff PPE, and extensive cleaning of exam room while observing appropriate contact time as indicated for disinfecting solutions.  Subjective:     Patient ID: Donna Ayers , female    DOB: 1961-08-14 , 59 y.o.   MRN: [id number]   Chief Complaint  Patient presents with  . Annual Exam    HPI  She is here today for a full physical exam. She is not followed by GYN. She had pap smear performed last year and it was normal, HPV negative.     History reviewed. No pertinent past medical history.   Family History  Problem Relation Age of Onset  . Diabetes Mother   . Hypertension Father   . Breast cancer Neg Hx      Current Outpatient Medications:  .  Cholecalciferol (VITAMIN D3) 50 MCG (2000 UT) capsule, Take 2,000 Units by mouth daily., Disp: , Rfl:  .  Fluocinolone Acetonide Scalp 0.01 % OIL, APPLY TO SCALP AFTER SHAMPOO, Disp: 118.28 mL, Rfl: 5 .  Multiple Vitamins-Minerals (MULTIVITAL) CHEW, Chew by mouth., Disp: , Rfl:    No Known Allergies   Review of Systems  Constitutional: Negative.        She inquired about her weight today. Feels she has gained weight in hips/thighs. She is still exercising regularly. She prefers cardio over weights.   HENT: Negative.   Eyes: Negative.   Respiratory: Negative.   Cardiovascular: Negative.   Endocrine: Negative.   Genitourinary: Negative.        She wants to be checked for menopause. She reports her last cycle was five years ago. She wants to know if menopause is causing her to gain weight.   Musculoskeletal: Negative.   Skin: Negative.   Allergic/Immunologic: Negative.   Neurological: Negative.   Hematological: Negative.   Psychiatric/Behavioral: Negative.      Today's Vitals   12/07/19 0911  BP: 112/70  Pulse: 74  Temp: 98.3 F (36.8 C)  TempSrc: Oral   Weight: 150 lb 12.8 oz (68.4 kg)  Height: 5' 6.2" (1.681 m)   Body mass index is 24.19 kg/m.   Objective:  Physical Exam Vitals and nursing note reviewed.  Constitutional:      Appearance: Normal appearance.  HENT:     Head: Normocephalic and atraumatic.     Right Ear: Tympanic membrane, ear canal and external ear normal.     Left Ear: Tympanic membrane, ear canal and external ear normal.     Nose:     Comments: Deferred, masked    Mouth/Throat:     Comments: Deferred,masked Eyes:     Extraocular Movements: Extraocular movements intact.     Conjunctiva/sclera: Conjunctivae normal.     Pupils: Pupils are equal, round, and reactive to light.  Cardiovascular:     Rate and Rhythm: Normal rate and regular rhythm.     Pulses: Normal pulses.     Heart sounds: Normal heart sounds.  Pulmonary:     Effort: Pulmonary effort is normal.     Breath sounds: Normal breath sounds.  Chest:     Breasts: Tanner Score is 5.        Right: Normal.        Left: Normal.  Abdominal:     General: Bowel sounds are normal.     Palpations: Abdomen is soft.  Genitourinary:    Comments:  deferred Musculoskeletal:        General: Normal range of motion.     Cervical back: Normal range of motion and neck supple.  Skin:    General: Skin is warm and dry.  Neurological:     General: No focal deficit present.     Mental Status: She is alert and oriented to person, place, and time.  Psychiatric:        Mood and Affect: Mood normal.        Behavior: Behavior normal.         Assessment And Plan:     1. Routine general medical examination at health care facility  A full exam was performed.  She is encouraged to perform monthly self breast exams. I did review her colonoscopy report from 76. PATIENT IS ADVISED TO GET 30-45 MINUTES REGULAR EXERCISE NO LESS THAN FOUR TO FIVE DAYS PER WEEK - BOTH WEIGHTBEARING EXERCISES AND AEROBIC ARE RECOMMENDED.  SHE IS ADVISED TO FOLLOW A HEALTHY DIET WITH AT LEAST  SIX FRUITS/VEGGIES PER DAY, DECREASE INTAKE OF RED MEAT, AND TO INCREASE FISH INTAKE TO TWO DAYS PER WEEK.  MEATS/FISH SHOULD NOT BE FRIED, BAKED OR BROILED IS PREFERABLE.  I SUGGEST WEARING SPF 50 SUNSCREEN ON EXPOSED PARTS AND ESPECIALLY WHEN IN THE DIRECT SUNLIGHT FOR AN EXTENDED PERIOD OF TIME.  PLEASE AVOID FAST FOOD RESTAURANTS AND INCREASE YOUR WATER INTAKE.  - CMP14+EGFR - CBC - Lipid panel - TSH - FSH  2. Weight gain  Wt Readings from Last 3 Encounters:  12/07/19 150 lb 12.8 oz (68.4 kg)  03/08/19 144 lb 3.2 oz (65.4 kg)  11/30/18 144 lb (65.3 kg)   She is aware of weight gain. She is encouraged to incorporate more strength training into her workout routine. She is reminded that this will help maintain her bone health as well.   3. Postmenopausal  Pt advised that she is definitely postmenopausal. However, I will check [facility] today as requested.      Maximino Greenland, MD    THE PATIENT IS ENCOURAGED TO PRACTICE SOCIAL DISTANCING DUE TO THE COVID-19 PANDEMIC.

## 2019-12-13 ENCOUNTER — Telehealth: Payer: Self-pay

## 2019-12-13 NOTE — Telephone Encounter (Signed)
The pt was asked when she could return to do her blood work that wasn't done the day of her visit.

## 2019-12-14 DIAGNOSIS — Z0000 Encounter for general adult medical examination without abnormal findings: Secondary | ICD-10-CM | POA: Diagnosis not present

## 2019-12-15 ENCOUNTER — Encounter: Payer: Self-pay | Admitting: Internal Medicine

## 2019-12-15 LAB — LIPID PANEL
Chol/HDL Ratio: 2.6 ratio (ref 0.0–4.4)
Cholesterol, Total: 166 mg/dL (ref 100–199)
HDL: 64 mg/dL (ref >39)
LDL Chol Calc (NIH): 92 mg/dL (ref 0–99)
Triglycerides: 51 mg/dL (ref 0–149)
VLDL Cholesterol Cal: 10 mg/dL (ref 5–40)

## 2019-12-15 LAB — CMP14+EGFR
ALT: 16 IU/L (ref 0–32)
AST: 22 IU/L (ref 0–40)
Albumin/Globulin Ratio: 1.3 (ref 1.2–2.2)
Albumin: 4.1 g/dL (ref 3.8–4.9)
Alkaline Phosphatase: 96 IU/L (ref 39–117)
BUN/Creatinine Ratio: 8 — ABNORMAL LOW (ref 9–23)
BUN: 6 mg/dL (ref 6–24)
Bilirubin Total: 0.3 mg/dL (ref 0.0–1.2)
CO2: 27 mmol/L (ref 20–29)
Calcium: 9.6 mg/dL (ref 8.7–10.2)
Chloride: 103 mmol/L (ref 96–106)
Creatinine, Ser: 0.75 mg/dL (ref 0.57–1.00)
GFR calc Af Amer: 103 mL/min/{1.73_m2} (ref >59)
GFR calc non Af Amer: 89 mL/min/{1.73_m2} (ref >59)
Globulin, Total: 3.1 g/dL (ref 1.5–4.5)
Glucose: 91 mg/dL (ref 65–99)
Potassium: 4.3 mmol/L (ref 3.5–5.2)
Sodium: 140 mmol/L (ref 134–144)
Total Protein: 7.2 g/dL (ref 6.0–8.5)

## 2019-12-15 LAB — CBC
Hematocrit: 39.2 % (ref 34.0–46.6)
Hemoglobin: 13.4 g/dL (ref 11.1–15.9)
MCH: 30.9 pg (ref 26.6–33.0)
MCHC: 34.2 g/dL (ref 31.5–35.7)
MCV: 90 fL (ref 79–97)
Platelets: 214 10*3/uL (ref 150–450)
RBC: 4.34 x10E6/uL (ref 3.77–5.28)
RDW: 11.5 % — ABNORMAL LOW (ref 11.7–15.4)
WBC: 3.3 10*3/uL — ABNORMAL LOW (ref 3.4–10.8)

## 2019-12-15 LAB — FOLLICLE STIMULATING HORMONE: FSH: 123 m[IU]/mL

## 2019-12-15 LAB — TSH: TSH: 1.85 u[IU]/mL (ref 0.450–4.500)

## 2019-12-27 ENCOUNTER — Telehealth: Payer: Self-pay

## 2019-12-27 NOTE — Telephone Encounter (Signed)
The pt wanted to know what she can do to get her WBC count up because it was low.  The pt was told that Dr. Allyne Gee said that a low WBC count isn't unusual in african americans and that the pt can have it rechecked every 3 months.

## 2020-07-20 ENCOUNTER — Encounter: Payer: Self-pay | Admitting: Nurse Practitioner

## 2020-07-20 ENCOUNTER — Other Ambulatory Visit: Payer: Self-pay

## 2020-07-20 ENCOUNTER — Ambulatory Visit: Payer: 59 | Admitting: Nurse Practitioner

## 2020-07-20 VITALS — BP 110/78 | HR 90 | Temp 98.4°F

## 2020-07-20 DIAGNOSIS — M25532 Pain in left wrist: Secondary | ICD-10-CM

## 2020-07-20 MED ORDER — DICLOFENAC SODIUM 1 % EX GEL: 2.0000 g | Freq: Four times a day (QID) | CUTANEOUS | 2 refills | Status: DC

## 2020-07-20 MED ORDER — TRIAMCINOLONE ACETONIDE 40 MG/ML IJ SUSP
60.0000 mg | Freq: Once | INTRAMUSCULAR | Status: AC
  Administered 2020-07-20: 60 mg via INTRAMUSCULAR

## 2020-07-20 MED FILL — DICLOFENAC SODIUM 1 % GEL: 1 | 13 days supply | Qty: 100 | Fill #0

## 2020-07-20 NOTE — Progress Notes (Signed)
I,Yamilka Roman [employer] as a Neurosurgeon for [employer], FNP.,have documented all relevant documentation on the behalf of Arnette Felts, FNP,as directed by  Arnette Felts, FNP while in the presence of Arnette Felts, FNP.  This visit occurred during the SARS-CoV-2 public health emergency.  Safety protocols were in place, including screening questions prior to the visit, additional usage of staff PPE, and extensive cleaning of exam room while observing appropriate contact time as indicated for disinfecting solutions.  Subjective:     Patient ID: Donna Ayers , female    DOB: 06/21/1961 , 59 y.o.   MRN: [id number]   Chief Complaint  Patient presents with  . Hand Pain    Patient stated her left wrist has been hurting her she is unsure if she hit it or not. she reports some swelling     HPI  Wrist Pain  The pain is present in the left hand. This is a new problem. The current episode started more than 1 month ago. History of extremity trauma: unknown. The problem occurs constantly. The problem has been gradually improving. The quality of the pain is described as dull. The pain is at a severity of 3/10. The pain is mild. Associated symptoms include joint swelling. Pertinent negatives include no fever, stiffness or tingling. The symptoms are aggravated by activity (unable to twist wrist). She has tried OTC ointments (epsom salt, ice cream) for the symptoms. Family history does not include gout.     History reviewed. No pertinent past medical history.   Family History  Problem Relation Age of Onset  . Diabetes Mother   . Hypertension Father   . Breast cancer Neg Hx      Current Outpatient Medications:  .  Cholecalciferol (VITAMIN D3) 50 MCG (2000 UT) capsule, Take 2,000 Units by mouth daily., Disp: , Rfl:  .  Fluocinolone Acetonide Scalp 0.01 % OIL, APPLY TO SCALP AFTER SHAMPOO, Disp: 118.28 mL, Rfl: 5 .  Multiple Vitamins-Minerals (MULTIVITAL) CHEW, Chew by mouth., Disp: , Rfl:  .   diclofenac Sodium (VOLTAREN) 1 % GEL, Apply 2 g topically 4 (four) times daily., Disp: 100 g, Rfl: 2   Allergies  Allergen Reactions  . Tramadol Other (See Comments)    vomiting     Review of Systems  Constitutional: Negative.  Negative for fever.  Respiratory: Negative.   Cardiovascular: Negative.   Musculoskeletal: Positive for arthralgias (left hand pain). Negative for stiffness.  Neurological: Negative for tingling.  Psychiatric/Behavioral: Negative.      Today's Vitals   07/20/20 1611  BP: 110/78  Pulse: 90  Temp: 98.4 F (36.9 C)  TempSrc: Oral  PainSc: 0-No pain   There is no height or weight on file to calculate BMI.   Objective:  Physical Exam Constitutional:      Appearance: Normal appearance.  Cardiovascular:     Rate and Rhythm: Normal rate and regular rhythm.  Musculoskeletal:        General: No swelling or tenderness.     Right lower leg: No edema.     Left lower leg: No edema.     Comments: Tenderness to left wrist on palpation, negative phalens  Neurological:     Mental Status: She is alert.  Psychiatric:        Mood and Affect: Mood normal.        Behavior: Behavior normal.        Thought Content: Thought content normal.        Judgment: Judgment normal.  Assessment And Plan:     1. Left wrist pain  If symptoms worsen will check xray  Osteoarthritis vs tenditis - diclofenac Sodium (VOLTAREN) 1 % GEL; Apply 2 g topically 4 (four) times daily.  Dispense: 100 g; Refill: 2 - triamcinolone acetonide (KENALOG-40) injection 60 mg     Patient was given opportunity to ask questions. Patient verbalized understanding of the plan and was able to repeat key elements of the plan. All questions were answered to their satisfaction.  Arnette Felts, FNP   I, Arnette Felts, FNP, have reviewed all documentation for this visit. The documentation on 07/24/20 for the exam, diagnosis, procedures, and orders are all accurate and complete.  THE PATIENT IS  ENCOURAGED TO PRACTICE SOCIAL DISTANCING DUE TO THE COVID-19 PANDEMIC.

## 2020-07-31 ENCOUNTER — Other Ambulatory Visit: Payer: Self-pay | Admitting: Nurse Practitioner

## 2020-07-31 DIAGNOSIS — M25532 Pain in left wrist: Secondary | ICD-10-CM

## 2020-08-02 ENCOUNTER — Telehealth: Payer: Self-pay

## 2020-08-02 ENCOUNTER — Ambulatory Visit
Admission: RE | Admit: 2020-08-02 | Discharge: 2020-08-02 | Disposition: A | Discharge status: Home / Self Care | Payer: 59 | Source: Ambulatory Visit | Attending: Nurse Practitioner | Admitting: Nurse Practitioner

## 2020-08-02 DIAGNOSIS — M25532 Pain in left wrist: Secondary | ICD-10-CM

## 2020-08-02 NOTE — Telephone Encounter (Signed)
LVM for pt to go over to [facility] to have Xray of wrist done

## 2020-08-21 ENCOUNTER — Other Ambulatory Visit: Payer: Self-pay | Admitting: Internal Medicine

## 2020-08-21 DIAGNOSIS — Z1231 Encounter for screening mammogram for malignant neoplasm of breast: Secondary | ICD-10-CM

## 2020-08-30 ENCOUNTER — Other Ambulatory Visit: Payer: Self-pay | Admitting: Internal Medicine

## 2020-08-30 MED FILL — FLUOCINOLONE ACETONIDE SCAL: 0.01 | 30 days supply | Qty: 118 | Fill #0

## 2020-09-18 ENCOUNTER — Ambulatory Visit
Admission: RE | Admit: 2020-09-18 | Discharge: 2020-09-18 | Disposition: A | Discharge status: Home / Self Care | Payer: 59 | Source: Ambulatory Visit | Attending: Internal Medicine | Admitting: Internal Medicine

## 2020-09-18 ENCOUNTER — Other Ambulatory Visit: Payer: Self-pay

## 2020-09-18 DIAGNOSIS — Z1231 Encounter for screening mammogram for malignant neoplasm of breast: Secondary | ICD-10-CM | POA: Diagnosis not present

## 2020-12-12 ENCOUNTER — Other Ambulatory Visit: Payer: Self-pay

## 2020-12-12 ENCOUNTER — Encounter: Payer: Self-pay | Admitting: Internal Medicine

## 2020-12-12 ENCOUNTER — Ambulatory Visit (INDEPENDENT_AMBULATORY_CARE_PROVIDER_SITE_OTHER): Payer: 59 | Admitting: Internal Medicine

## 2020-12-12 VITALS — BP 112/70 | HR 87 | Temp 98.0°F | Ht 66.2 in | Wt 152.0 lb

## 2020-12-12 DIAGNOSIS — L989 Disorder of the skin and subcutaneous tissue, unspecified: Secondary | ICD-10-CM

## 2020-12-12 DIAGNOSIS — Z86010 Personal history of colonic polyps: Secondary | ICD-10-CM

## 2020-12-12 DIAGNOSIS — D72819 Decreased white blood cell count, unspecified: Secondary | ICD-10-CM | POA: Diagnosis not present

## 2020-12-12 DIAGNOSIS — E559 Vitamin D deficiency, unspecified: Secondary | ICD-10-CM

## 2020-12-12 DIAGNOSIS — Z0000 Encounter for general adult medical examination without abnormal findings: Secondary | ICD-10-CM | POA: Diagnosis not present

## 2020-12-12 NOTE — Patient Instructions (Addendum)
Health Maintenance, Female Adopting a healthy lifestyle and getting preventive care are important in promoting health and wellness. Ask your health care provider about:  The right schedule for you to have regular tests and exams.  Things you can do on your own to prevent diseases and keep yourself healthy. What should I know about diet, weight, and exercise? Eat a healthy diet  Eat a diet that includes plenty of vegetables, fruits, low-fat dairy products, and lean protein.  Do not eat a lot of foods that are high in solid fats, added sugars, or sodium.   Maintain a healthy weight Body mass index (BMI) is used to identify weight problems. It estimates body fat based on height and weight. Your health care provider can help determine your BMI and help you achieve or maintain a healthy weight. Get regular exercise Get regular exercise. This is one of the most important things you can do for your health. Most adults should:  Exercise for at least 150 minutes each week. The exercise should increase your heart rate and make you sweat (moderate-intensity exercise).  Do strengthening exercises at least twice a week. This is in addition to the moderate-intensity exercise.  Spend less time sitting. Even light physical activity can be beneficial. Watch cholesterol and blood lipids Have your blood tested for lipids and cholesterol at 58 years of age, then have this test every 5 years. Have your cholesterol levels checked more often if:  Your lipid or cholesterol levels are high.  You are older than 58 years of age.  You are at high risk for heart disease. What should I know about cancer screening? Depending on your health history and family history, you may need to have cancer screening at various ages. This may include screening for:  Breast cancer.  Cervical cancer.  Colorectal cancer.  Skin cancer.  Lung cancer. What should I know about heart disease, diabetes, and high blood  pressure? Blood pressure and heart disease  High blood pressure causes heart disease and increases the risk of stroke. This is more likely to develop in people who have high blood pressure readings, are of African descent, or are overweight.  Have your blood pressure checked: ? Every 3-5 years if you are 18-39 years of age. ? Every year if you are 40 years old or older. Diabetes Have regular diabetes screenings. This checks your fasting blood sugar level. Have the screening done:  Once every three years after age 40 if you are at a normal weight and have a low risk for diabetes.  More often and at a younger age if you are overweight or have a high risk for diabetes. What should I know about preventing infection? Hepatitis B If you have a higher risk for hepatitis B, you should be screened for this virus. Talk with your health care provider to find out if you are at risk for hepatitis B infection. Hepatitis C Testing is recommended for:  Everyone born from 1945 through 1965.  Anyone with known risk factors for hepatitis C. Sexually transmitted infections (STIs)  Get screened for STIs, including gonorrhea and chlamydia, if: ? You are sexually active and are younger than 58 years of age. ? You are older than 58 years of age and your health care provider tells you that you are at risk for this type of infection. ? Your sexual activity has changed since you were last screened, and you are at increased risk for chlamydia or gonorrhea. Ask your health care provider   if you are at risk.  Ask your health care provider about whether you are at high risk for HIV. Your health care provider may recommend a prescription medicine to help prevent HIV infection. If you choose to take medicine to prevent HIV, you should first get tested for HIV. You should then be tested every 3 months for as long as you are taking the medicine. Pregnancy  If you are about to stop having your period (premenopausal) and  you may become pregnant, seek counseling before you get pregnant.  Take 400 to 800 micrograms (mcg) of folic acid every day if you become pregnant.  Ask for birth control (contraception) if you want to prevent pregnancy. Osteoporosis and menopause Osteoporosis is a disease in which the bones lose minerals and strength with aging. This can result in bone fractures. If you are 65 years old or older, or if you are at risk for osteoporosis and fractures, ask your health care provider if you should:  Be screened for bone loss.  Take a calcium or vitamin D supplement to lower your risk of fractures.  Be given hormone replacement therapy (HRT) to treat symptoms of menopause. Follow these instructions at home: Lifestyle  Do not use any products that contain nicotine or tobacco, such as cigarettes, e-cigarettes, and chewing tobacco. If you need help quitting, ask your health care provider.  Do not use street drugs.  Do not share needles.  Ask your health care provider for help if you need support or information about quitting drugs. Alcohol use  Do not drink alcohol if: ? Your health care provider tells you not to drink. ? You are pregnant, may be pregnant, or are planning to become pregnant.  If you drink alcohol: ? Limit how much you use to 0-1 drink a day. ? Limit intake if you are breastfeeding.  Be aware of how much alcohol is in your drink. In the [place] one drink equals one 12 oz bottle of beer (355 mL), one 5 oz glass of wine (148 mL), or one 1 oz glass of hard liquor (44 mL). General instructions  Schedule regular health, dental, and eye exams.  Stay current with your vaccines.  Tell your health care provider if: ? You often feel depressed. ? You have ever been abused or do not feel safe at home. Summary  Adopting a healthy lifestyle and getting preventive care are important in promoting health and wellness.  Follow your health care provider's instructions about healthy  diet, exercising, and getting tested or screened for diseases.  Follow your health care provider's instructions on monitoring your cholesterol and blood pressure. This information is not intended to replace advice given to you by your health care provider. Make sure you discuss any questions you have with your health care provider. Document Revised: 10/28/2018 Document Reviewed: 10/28/2018 Elsevier Patient Education  2021 Elsevier Inc.  

## 2020-12-12 NOTE — Progress Notes (Signed)
I,Katawbba Wiggins,acting as a Education administrator for Maximino Greenland, MD.,have documented all relevant documentation on the behalf of Maximino Greenland, MD,as directed by  Maximino Greenland, MD while in the presence of Maximino Greenland, MD.  This visit occurred during the SARS-CoV-2 public health emergency.  Safety protocols were in place, including screening questions prior to the visit, additional usage of staff PPE, and extensive cleaning of exam room while observing appropriate contact time as indicated for disinfecting solutions.  Subjective:     Patient ID: Gevena Ayers , female    DOB: Sep 29, 1961 , 60 y.o.   MRN: [id number]   Chief Complaint  Patient presents with  . Annual Exam    HPI  She is here today for a full physical exam. She is not followed by GYN. She had pap smear performed 2020 and it was normal, HPV negative. She has no specific concerns or complaints at this time.     Past Medical History:  Diagnosis Date  . Leukopenia      Family History  Problem Relation Age of Onset  . Diabetes Mother   . Hypertension Father   . Breast cancer Neg Hx      Current Outpatient Medications:  .  Cholecalciferol (VITAMIN D3) 50 MCG (2000 UT) capsule, Take 2,000 Units by mouth daily., Disp: , Rfl:  .  diclofenac Sodium (VOLTAREN) 1 % GEL, Apply 2 g topically 4 (four) times daily., Disp: 100 g, Rfl: 2 .  Fluocinolone Acetonide Scalp 0.01 % OIL, APPLY TO SCALP AFTER SHAMPOO, Disp: 118.28 mL, Rfl: 5 .  Multiple Vitamins-Minerals (MULTIVITAL) CHEW, Chew by mouth. 2 gummies 2 times per day, Disp: , Rfl:    Allergies  Allergen Reactions  . Tramadol Other (See Comments)    vomiting      The patient states she uses none for birth control. Last LMP was No LMP recorded. Patient is postmenopausal.. Negative for Dysmenorrhea. Negative for: breast discharge, breast lump(s), breast pain and breast self exam. Associated symptoms include abnormal vaginal bleeding. Pertinent negatives include abnormal  bleeding (hematology), anxiety, decreased libido, depression, difficulty falling sleep, dyspareunia, history of infertility, nocturia, sexual dysfunction, sleep disturbances, urinary incontinence, urinary urgency, vaginal discharge and vaginal itching. Diet regular.The patient states her exercise level is  moderate/srenuous.   . The patient's tobacco use is:  Social History   Tobacco Use  Smoking Status Never Smoker  Smokeless Tobacco Never Used  . She has been exposed to passive smoke. The patient's alcohol use is:  Social History   Substance and Sexual Activity  Alcohol Use Never   Review of Systems  Constitutional: Negative.   HENT: Negative.   Eyes: Negative.   Respiratory: Negative.   Cardiovascular: Negative.   Gastrointestinal: Negative.   Endocrine: Negative.   Genitourinary: Negative.   Musculoskeletal: Negative.   Skin: Negative.        She has lesion on right inner thigh - she first noticed last summer. It has not changed in size/shape. She deines blisters. She denies having similar sx in the past.   Allergic/Immunologic: Negative.   Neurological: Negative.   Hematological: Negative.   Psychiatric/Behavioral: Negative.      Today's Vitals   12/12/20 0927  BP: 112/70  Pulse: 87  Temp: 98 F (36.7 C)  TempSrc: Oral  Weight: 152 lb (68.9 kg)  Height: 5' 6.2" (1.681 m)   Body mass index is 24.39 kg/m.  Wt Readings from Last 3 Encounters:  12/12/20 152 lb (68.9 kg)  12/07/19 150 lb 12.8 oz (68.4 kg)  03/08/19 144 lb 3.2 oz (65.4 kg)   Objective:  Physical Exam Vitals and nursing note reviewed.  Constitutional:      General: She is not in acute distress.    Appearance: Normal appearance. She is well-developed.  HENT:     Head: Normocephalic and atraumatic.     Right Ear: Hearing, tympanic membrane, ear canal and external ear normal. There is no impacted cerumen.     Left Ear: Hearing, tympanic membrane, ear canal and external ear normal. There is no  impacted cerumen.     Nose:     Comments: Deferred, masked    Mouth/Throat:     Comments: Deferred, masked Eyes:     General: Lids are normal.     Extraocular Movements: Extraocular movements intact.     Conjunctiva/sclera: Conjunctivae normal.  Neck:     Thyroid: No thyroid mass.     Vascular: No carotid bruit.  Cardiovascular:     Rate and Rhythm: Normal rate and regular rhythm.     Pulses: Normal pulses.     Heart sounds: Normal heart sounds. No murmur heard.   Pulmonary:     Effort: Pulmonary effort is normal.     Breath sounds: Normal breath sounds.  Chest:  Breasts:     Tanner Score is 5.     Right: Normal.     Left: Normal.    Abdominal:     General: Abdomen is flat. Bowel sounds are normal. There is no distension.     Palpations: Abdomen is soft.     Tenderness: There is no abdominal tenderness.  Genitourinary:    Comments: Deferred Musculoskeletal:        General: No swelling. Normal range of motion.     Cervical back: Full passive range of motion without pain, normal range of motion and neck supple.     Right lower leg: No edema.     Left lower leg: No edema.  Skin:    General: Skin is warm and dry.     Capillary Refill: Capillary refill takes less than 2 seconds.     Comments: Skin lesions, right inner thigh. Slightly scaly. No vesicular lesions noted. No overlying erythema.   Neurological:     General: No focal deficit present.     Mental Status: She is alert and oriented to person, place, and time.     Cranial Nerves: No cranial nerve deficit.     Sensory: No sensory deficit.  Psychiatric:        Mood and Affect: Mood normal.        Behavior: Behavior normal.        Thought Content: Thought content normal.        Judgment: Judgment normal.         Assessment And Plan:     1. Routine general medical examination at health care facility Comments: A full exam was performed. Importance of monthly self breast exams was discussed with the patient.  PATIENT IS ADVISED TO GET 30-45 MINUTES REGULAR EXERCISE NO LESS THAN FOUR TO FIVE DAYS PER WEEK - BOTH WEIGHTBEARING EXERCISES AND AEROBIC ARE RECOMMENDED.  PATIENT IS ADVISED TO FOLLOW A HEALTHY DIET WITH AT LEAST SIX FRUITS/VEGGIES PER DAY, DECREASE INTAKE OF RED MEAT, AND TO INCREASE FISH INTAKE TO TWO DAYS PER WEEK.  MEATS/FISH SHOULD NOT BE FRIED, BAKED OR BROILED IS PREFERABLE.  I SUGGEST WEARING SPF 50 SUNSCREEN ON EXPOSED PARTS AND ESPECIALLY WHEN  IN THE DIRECT SUNLIGHT FOR AN EXTENDED PERIOD OF TIME.  PLEASE AVOID FAST FOOD [employer]URANTS AND INCREASE YOUR WATER INTAKE. c - CMP14+EGFR - Lipid panel - CBC with Diff  2. Skin lesion Comments: I suggested rx triamcinolone cream. She elects to get OTC hydrocortisone cream.   3. Leukopenia, unspecified type Comments: Previous WBC results reviewed. Pt advised most recent WBC has improved. I will recheck today. She would like to see specialist if needed.   4. Vitamin D deficiency disease Comments: I will check vitamin D level and supplement as needed.  - Vitamin D (25 hydroxy) c 5. Personal history of colonic polyps Comments: Her last colonoscopy was in December 2015. I will refer her to GI for CRC screening.  - Ambulatory referral to Gastroenterology  Patient was given opportunity to ask questions. Patient verbalized understanding of the plan and was able to repeat key elements of the plan. All questions were answered to their satisfaction.  Maximino Greenland, MD   I, Maximino Greenland, MD, have reviewed all documentation for this visit. The documentation on 12/13/20 for the exam, diagnosis, procedures, and orders are all accurate and complete.  THE PATIENT IS ENCOURAGED TO PRACTICE SOCIAL DISTANCING DUE TO THE COVID-19 PANDEMIC.

## 2020-12-13 ENCOUNTER — Encounter: Payer: Self-pay | Admitting: Internal Medicine

## 2020-12-13 LAB — VITAMIN D 25 HYDROXY (VIT D DEFICIENCY, FRACTURES): Vit D, 25-Hydroxy: 50.7 ng/mL (ref 30.0–100.0)

## 2020-12-13 LAB — CBC WITH DIFFERENTIAL/PLATELET
Basophils Absolute: 0 10*3/uL (ref 0.0–0.2)
Basos: 1 %
EOS (ABSOLUTE): 0 10*3/uL (ref 0.0–0.4)
Eos: 0 %
Hematocrit: 41.5 % (ref 34.0–46.6)
Hemoglobin: 14 g/dL (ref 11.1–15.9)
Immature Grans (Abs): 0 10*3/uL (ref 0.0–0.1)
Immature Granulocytes: 0 %
Lymphocytes Absolute: 1.6 10*3/uL (ref 0.7–3.1)
Lymphs: 40 %
MCH: 30.3 pg (ref 26.6–33.0)
MCHC: 33.7 g/dL (ref 31.5–35.7)
MCV: 90 fL (ref 79–97)
Monocytes Absolute: 0.3 10*3/uL (ref 0.1–0.9)
Monocytes: 8 %
Neutrophils Absolute: 2.1 10*3/uL (ref 1.4–7.0)
Neutrophils: 51 %
Platelets: 232 10*3/uL (ref 150–450)
RBC: 4.62 x10E6/uL (ref 3.77–5.28)
RDW: 11.1 % — ABNORMAL LOW (ref 11.7–15.4)
WBC: 4.1 10*3/uL (ref 3.4–10.8)

## 2020-12-13 LAB — CMP14+EGFR
ALT: 17 IU/L (ref 0–32)
AST: 21 IU/L (ref 0–40)
Albumin/Globulin Ratio: 1.5 (ref 1.2–2.2)
Albumin: 4.6 g/dL (ref 3.8–4.9)
Alkaline Phosphatase: 99 IU/L (ref 44–121)
BUN/Creatinine Ratio: 10 (ref 9–23)
BUN: 8 mg/dL (ref 6–24)
Bilirubin Total: 0.4 mg/dL (ref 0.0–1.2)
CO2: 24 mmol/L (ref 20–29)
Calcium: 10.2 mg/dL (ref 8.7–10.2)
Chloride: 103 mmol/L (ref 96–106)
Creatinine, Ser: 0.78 mg/dL (ref 0.57–1.00)
GFR calc Af Amer: 98 mL/min/{1.73_m2} (ref >59)
GFR calc non Af Amer: 85 mL/min/{1.73_m2} (ref >59)
Globulin, Total: 3 g/dL (ref 1.5–4.5)
Glucose: 90 mg/dL (ref 65–99)
Potassium: 4.9 mmol/L (ref 3.5–5.2)
Sodium: 142 mmol/L (ref 134–144)
Total Protein: 7.6 g/dL (ref 6.0–8.5)

## 2020-12-13 LAB — LIPID PANEL
Chol/HDL Ratio: 2.7 ratio (ref 0.0–4.4)
Cholesterol, Total: 207 mg/dL — ABNORMAL HIGH (ref 100–199)
HDL: 78 mg/dL (ref >39)
LDL Chol Calc (NIH): 120 mg/dL — ABNORMAL HIGH (ref 0–99)
Triglycerides: 52 mg/dL (ref 0–149)
VLDL Cholesterol Cal: 9 mg/dL (ref 5–40)

## 2021-03-26 DIAGNOSIS — Z20822 Contact with and (suspected) exposure to covid-19: Secondary | ICD-10-CM | POA: Diagnosis not present

## 2021-04-06 DIAGNOSIS — Z20822 Contact with and (suspected) exposure to covid-19: Secondary | ICD-10-CM | POA: Diagnosis not present

## 2021-04-20 DIAGNOSIS — Z20822 Contact with and (suspected) exposure to covid-19: Secondary | ICD-10-CM | POA: Diagnosis not present

## 2021-05-03 DIAGNOSIS — Z20822 Contact with and (suspected) exposure to covid-19: Secondary | ICD-10-CM | POA: Diagnosis not present

## 2021-05-18 DIAGNOSIS — Z20822 Contact with and (suspected) exposure to covid-19: Secondary | ICD-10-CM | POA: Diagnosis not present

## 2021-05-31 DIAGNOSIS — Z20822 Contact with and (suspected) exposure to covid-19: Secondary | ICD-10-CM | POA: Diagnosis not present

## 2021-06-15 DIAGNOSIS — Z20822 Contact with and (suspected) exposure to covid-19: Secondary | ICD-10-CM | POA: Diagnosis not present

## 2021-07-23 IMAGING — US US THYROID
1 series · 9 of 9 positions shown · non-contrast
Comparison: CT neck 04/13/2019 and neck ultrasound 03/30/2019.

CLINICAL DATA: Left neck lymph node.

EXAM:
ULTRASOUND OF HEAD/NECK SOFT TISSUES
TECHNIQUE: Ultrasound examination of the head and neck soft tissues was
performed in the area of clinical concern.

[Series 1: us thyroid · 0.05mm/px · 9 of 9 slices shown]
[im 1/9]
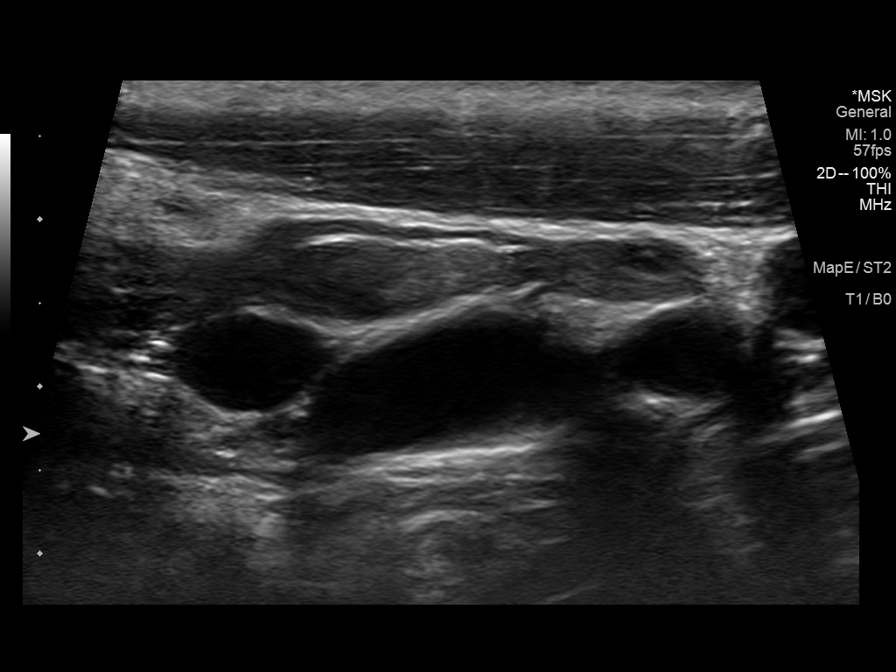
[im 2/9]
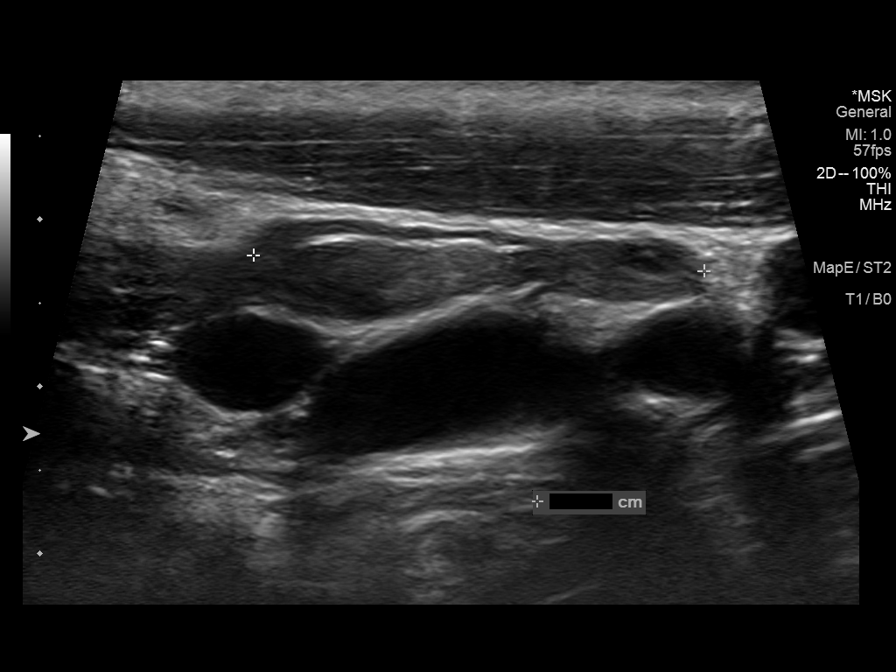
[im 3/9]
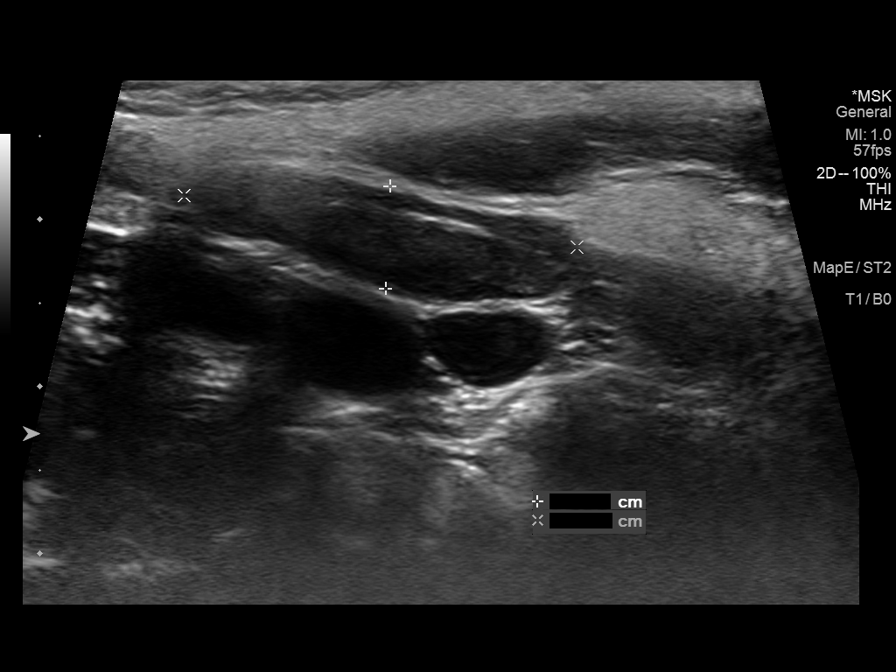
[im 4/9]
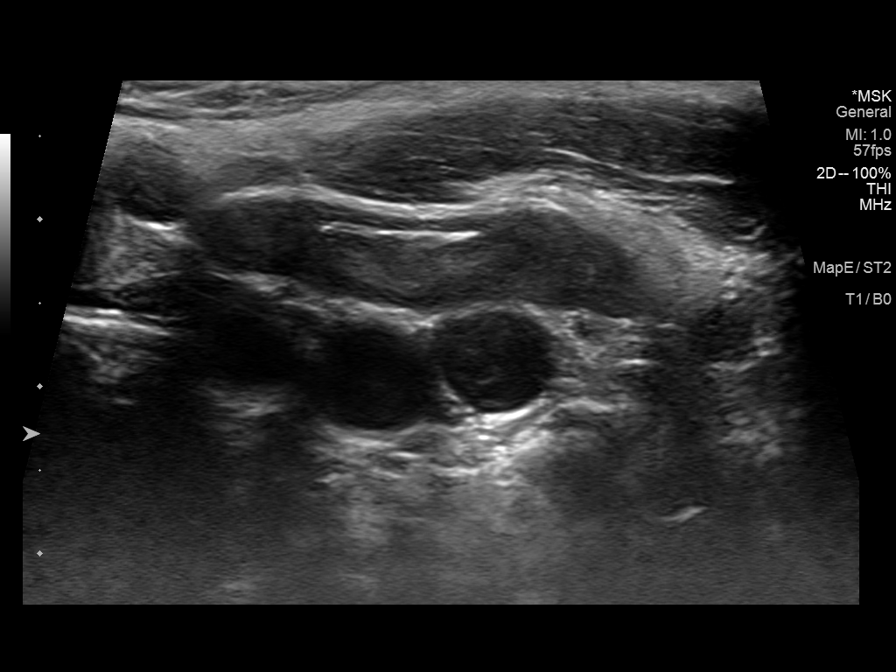
[im 5/9]
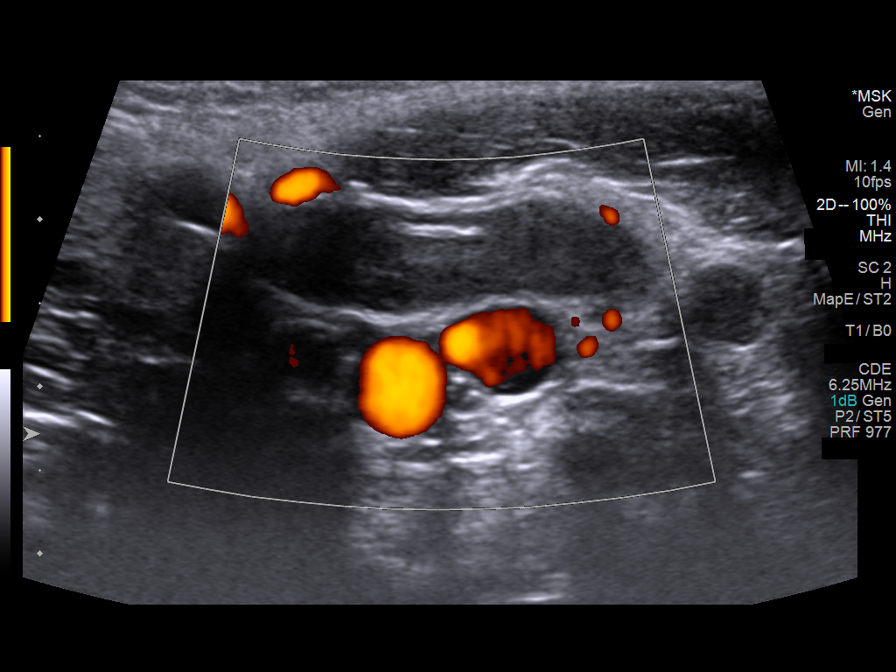
[im 6/9]
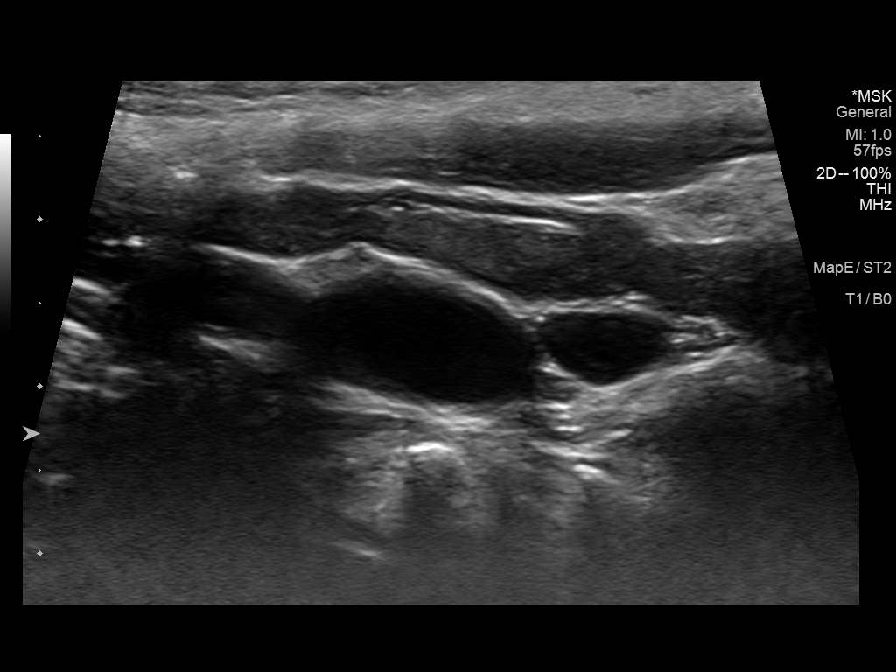
[im 7/9]
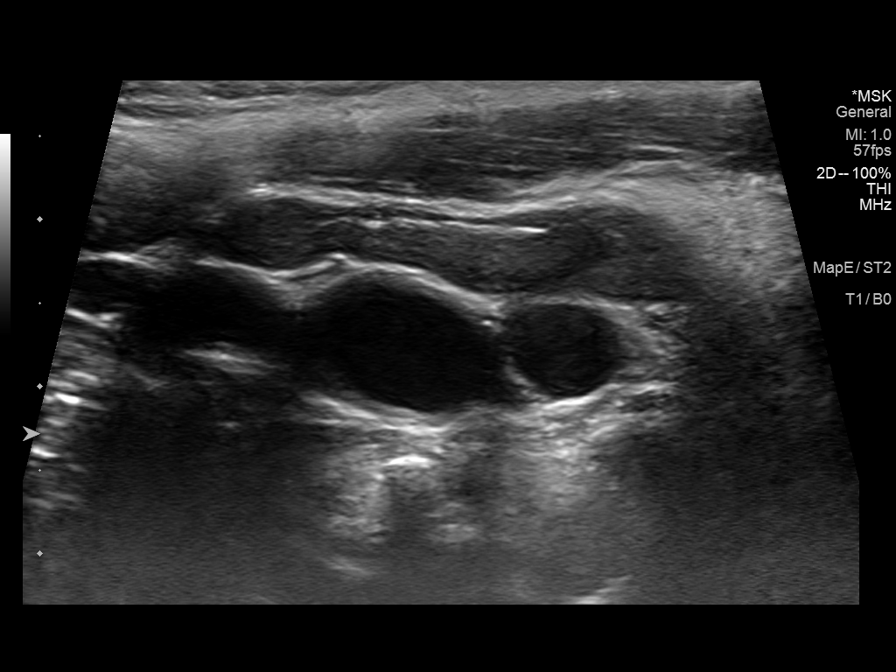
[im 8/9]
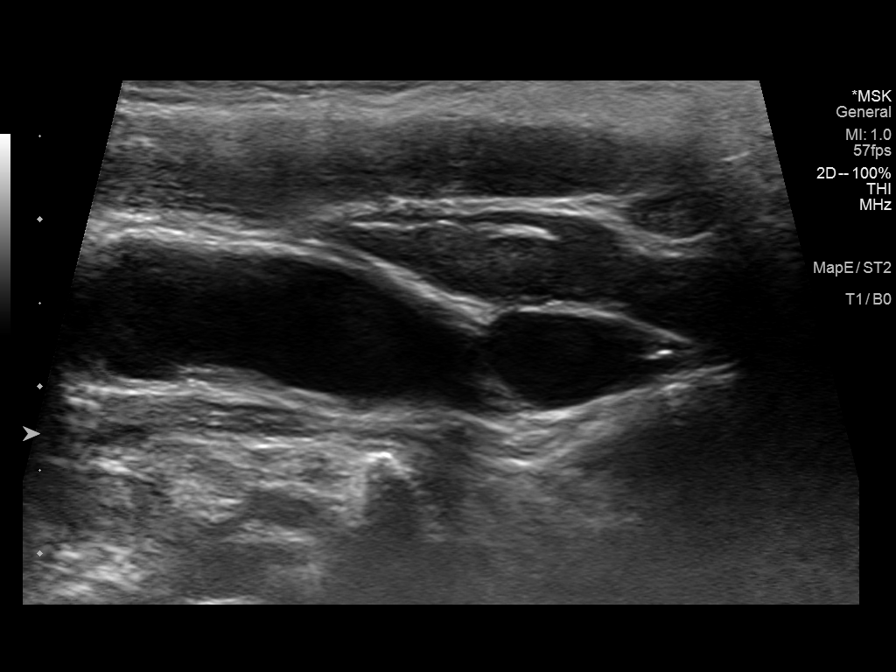
[im 9/9]
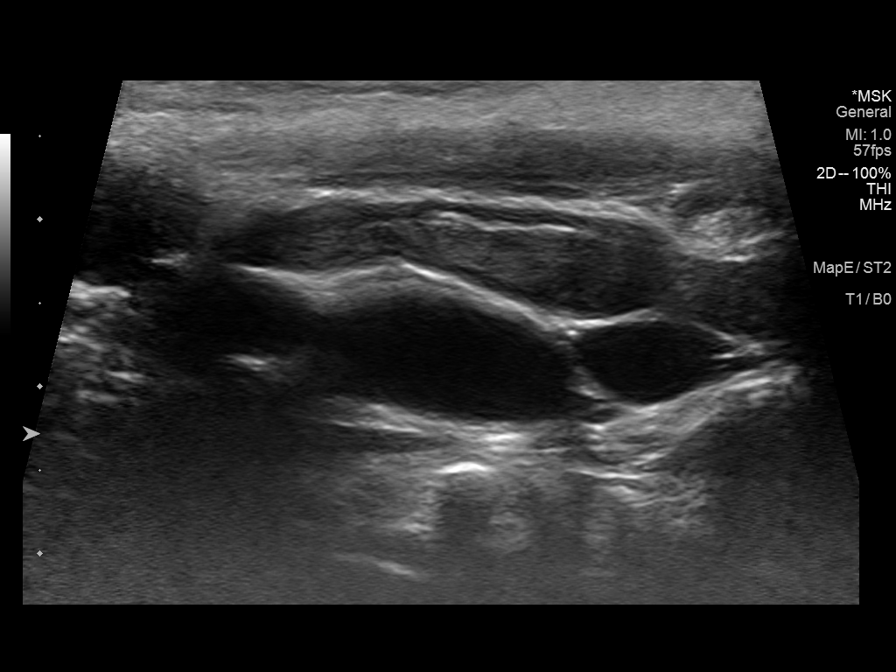

[9 of 9 positions shown; findings below may reference images not displayed]

FINDINGS: An elongated left level 2 lymph node demonstrate significant
decrease in short access measurements on today's study. The node now
measures 2.7 x 0.6 x 2.4 cm. A benign appearance central fatty hilum
is present.
IMPRESSION: 1. Significant reduction in size of left level 2 lymph node. The
appearance is most consistent with reactive nodes versus malignancy.

## 2021-07-27 DIAGNOSIS — Z20822 Contact with and (suspected) exposure to covid-19: Secondary | ICD-10-CM | POA: Diagnosis not present

## 2021-07-31 DIAGNOSIS — H524 Presbyopia: Secondary | ICD-10-CM | POA: Diagnosis not present

## 2021-08-24 ENCOUNTER — Other Ambulatory Visit: Payer: Self-pay | Admitting: Internal Medicine

## 2021-08-24 DIAGNOSIS — Z1231 Encounter for screening mammogram for malignant neoplasm of breast: Secondary | ICD-10-CM

## 2021-08-28 DIAGNOSIS — Z1152 Encounter for screening for COVID-19: Secondary | ICD-10-CM | POA: Diagnosis not present

## 2021-09-25 ENCOUNTER — Other Ambulatory Visit: Payer: Self-pay

## 2021-09-25 ENCOUNTER — Ambulatory Visit
Admission: RE | Admit: 2021-09-25 | Discharge: 2021-09-25 | Disposition: A | Discharge status: Home / Self Care | Payer: 59 | Source: Ambulatory Visit | Attending: Internal Medicine | Admitting: Internal Medicine

## 2021-09-25 DIAGNOSIS — Z1231 Encounter for screening mammogram for malignant neoplasm of breast: Secondary | ICD-10-CM | POA: Diagnosis not present

## 2021-10-01 ENCOUNTER — Encounter: Payer: Self-pay | Admitting: Nurse Practitioner

## 2021-10-01 ENCOUNTER — Other Ambulatory Visit: Payer: Self-pay

## 2021-10-01 ENCOUNTER — Ambulatory Visit (INDEPENDENT_AMBULATORY_CARE_PROVIDER_SITE_OTHER): Payer: 59 | Admitting: Nurse Practitioner

## 2021-10-01 VITALS — BP 120/70 | HR 100 | Temp 99.0°F | Ht 66.2 in | Wt 151.6 lb

## 2021-10-01 DIAGNOSIS — R5383 Other fatigue: Secondary | ICD-10-CM

## 2021-10-01 DIAGNOSIS — R079 Chest pain, unspecified: Secondary | ICD-10-CM

## 2021-10-01 NOTE — Progress Notes (Signed)
I,Tianna Badgett,acting as a Education administrator for [employer], FNP.,have documented all relevant documentation on the behalf of Minette Brine, FNP,as directed by  Minette Brine, FNP while in the presence of Minette Brine, [place].  This visit occurred during the SARS-CoV-2 public health emergency.  Safety protocols were in place, including screening questions prior to the visit, additional usage of staff PPE, and extensive cleaning of exam room while observing appropriate contact time as indicated for disinfecting solutions.  Subjective:     Patient ID: Donna Ayers , female    DOB: 10-09-1961 , 60 y.o.   MRN: [id number]   Chief Complaint  Patient presents with   Chest Pain    HPI  Patient is here for chest pain. She states that this chest pain is making her very tired. Reports drinking about one gallon of water a day.    Chest Pain  This is a new problem. The current episode started 1 to 4 weeks ago (2 weeks ago). The problem occurs constantly. The problem has been unchanged. The pain is at a severity of 2/10. The quality of the pain is described as pressure (light). The pain does not radiate. Associated symptoms include malaise/fatigue and palpitations. Pertinent negatives include no abdominal pain, cough, dizziness, headaches, nausea, shortness of breath or vomiting. The pain is aggravated by nothing. She has tried rest for the symptoms. Risk factors: diet mountain dews.  Her family medical history is significant for CAD.    Past Medical History:  Diagnosis Date   Leukopenia      Family History  Problem Relation Age of Onset   Diabetes Mother    Hypertension Father    Breast cancer Neg Hx      Current Outpatient Medications:    Cholecalciferol (VITAMIN D3) 50 MCG (2000 UT) capsule, Take 2,000 Units by mouth daily., Disp: , Rfl:    diclofenac Sodium (VOLTAREN) 1 % GEL, Apply 2 g topically 4 (four) times daily., Disp: 100 g, Rfl: 2   ferrous sulfate 325 (65 FE) MG EC tablet, Take 1 tablet  (325 mg total) by mouth 2 (two) times daily., Disp: 60 tablet, Rfl: 2   Multiple Vitamins-Minerals (MULTIVITAL) CHEW, Chew by mouth. 2 gummies 2 times per day, Disp: , Rfl:    Allergies  Allergen Reactions   Tramadol Other (See Comments)    vomiting     Review of Systems  Constitutional:  Positive for fatigue and malaise/fatigue.  Respiratory: Negative.  Negative for cough, shortness of breath and wheezing.   Cardiovascular:  Positive for chest pain and palpitations.  Gastrointestinal: Negative.  Negative for abdominal pain, nausea and vomiting.  Neurological: Negative.  Negative for dizziness and headaches.  Psychiatric/Behavioral: Negative.      Today's Vitals   10/01/21 1610  BP: 120/70  Pulse: 100  Temp: 99 F (37.2 C)  TempSrc: Oral  Weight: 151 lb 9.6 oz (68.8 kg)  Height: 5' 6.2" (1.681 m)   Body mass index is 24.32 kg/m.   Objective:  Physical Exam Constitutional:      Appearance: Normal appearance. She is well-developed.  Cardiovascular:     Rate and Rhythm: Regular rhythm. Tachycardia present.     Pulses:          Carotid pulses are 2+ on the right side and 2+ on the left side.      Radial pulses are 2+ on the right side and 2+ on the left side.     Heart sounds: Normal heart sounds. Heart sounds  not distant. No murmur heard.   No S3 or S4 sounds.  Pulmonary:     Effort: Pulmonary effort is normal.     Breath sounds: Normal breath sounds.  Musculoskeletal:        General: No swelling or tenderness.     Right lower leg: No tenderness. No edema.     Left lower leg: No tenderness. No edema.  Skin:    General: Skin is warm and dry.     Capillary Refill: Capillary refill takes less than 2 seconds.  Neurological:     Mental Status: She is alert.  Psychiatric:        Mood and Affect: Mood normal.        Behavior: Behavior normal.        Thought Content: Thought content normal.        Judgment: Judgment normal.        Assessment And Plan:     1. Chest  pain, unspecified type Comments: EKG done shows slight increase in heart rate at 107 otherwise normal Will check for metabolic causes  No pain illicited on palpation. - EKG 12-Lead - DG Chest 2 View; Future - CMP14+EGFR - TSH - Ambulatory referral to Cardiology  2. Other fatigue Comments: Will check for metabolic causes - DG Chest 2 View; Future - CMP14+EGFR - TSH - Iron, TIBC and Ferritin Panel - CBC - Vitamin B12 - Ambulatory referral to Cardiology     Patient was given opportunity to ask questions. Patient verbalized understanding of the plan and was able to repeat key elements of the plan. All questions were answered to their satisfaction.  Minette Brine, FNP   I, Minette Brine, FNP, have reviewed all documentation for this visit. The documentation on 10/01/21 for the exam, diagnosis, procedures, and orders are all accurate and complete.   IF YOU HAVE BEEN REFERRED TO A SPECIALIST, IT MAY TAKE 1-2 WEEKS TO SCHEDULE/PROCESS THE REFERRAL. IF YOU HAVE NOT HEARD FROM US/SPECIALIST IN TWO WEEKS, PLEASE GIVE [place] A CALL AT [phone] X [phone].   THE PATIENT IS ENCOURAGED TO PRACTICE SOCIAL DISTANCING DUE TO THE COVID-19 PANDEMIC.

## 2021-10-01 NOTE — Patient Instructions (Signed)
Chest Wall Pain °Chest wall pain is pain in or around the bones and muscles of your chest. Chest wall pain may be caused by: °An injury. °Coughing a lot. °Using your chest and arm muscles too much. °Sometimes, the cause may not be known. This pain may take a few weeks or longer to get better. °Follow these instructions at home: °Managing pain, stiffness, and swelling °If told, put ice on the painful area: °Put ice in a plastic bag. °Place a towel between your skin and the bag. °Leave the ice on for 20 minutes, 2-3 times a day. ° °Activity °Rest as told by your doctor. °Avoid doing things that cause pain. This includes lifting heavy items. °Ask your doctor what activities are safe for you. °General instructions ° °Take over-the-counter and prescription medicines only as told by your doctor. °Do not use any products that contain nicotine or tobacco, such as cigarettes, e-cigarettes, and chewing tobacco. If you need help quitting, ask your doctor. °Keep all follow-up visits as told by your doctor. This is important. °Contact a doctor if: °You have a fever. °Your chest pain gets worse. °You have new symptoms. °Get help right away if: °You feel sick to your stomach (nauseous) or you throw up (vomit). °You feel sweaty or light-headed. °You have a cough with mucus from your lungs (sputum) or you cough up blood. °You are short of breath. °These symptoms may be an emergency. Do not wait to see if the symptoms will go away. Get medical help right away. Call your local emergency services (911 in the [place] Do not drive yourself to the hospital. °Summary °Chest wall pain is pain in or around the bones and muscles of your chest. °It may be treated with ice, rest, and medicines. Your condition may also get better if you avoid doing things that cause pain. °Contact a doctor if you have a fever, chest pain that gets worse, or new symptoms. °Get help right away if you feel light-headed or you get short of breath. These symptoms may  be an emergency. °This information is not intended to replace advice given to you by your health care provider. Make sure you discuss any questions you have with your health care provider. °Document Revised: 02/06/2021 Document Reviewed: 01/19/2021 °Elsevier Patient Education © 2022 [employer] Inc[employer] ° °

## 2021-10-02 ENCOUNTER — Ambulatory Visit
Admission: RE | Admit: 2021-10-02 | Discharge: 2021-10-02 | Disposition: A | Discharge status: Home / Self Care | Payer: 59 | Source: Ambulatory Visit | Attending: Nurse Practitioner | Admitting: Nurse Practitioner

## 2021-10-02 DIAGNOSIS — R079 Chest pain, unspecified: Secondary | ICD-10-CM

## 2021-10-02 DIAGNOSIS — R5383 Other fatigue: Secondary | ICD-10-CM

## 2021-10-02 LAB — CBC
Hematocrit: 37.2 % (ref 34.0–46.6)
Hemoglobin: 12.4 g/dL (ref 11.1–15.9)
MCH: 30.5 pg (ref 26.6–33.0)
MCHC: 33.3 g/dL (ref 31.5–35.7)
MCV: 91 fL (ref 79–97)
Platelets: 316 10*3/uL (ref 150–450)
RBC: 4.07 x10E6/uL (ref 3.77–5.28)
RDW: 11.5 % — ABNORMAL LOW (ref 11.7–15.4)
WBC: 4.6 10*3/uL (ref 3.4–10.8)

## 2021-10-02 LAB — CMP14+EGFR
ALT: 16 IU/L (ref 0–32)
AST: 21 IU/L (ref 0–40)
Albumin/Globulin Ratio: 1.4 (ref 1.2–2.2)
Albumin: 4.4 g/dL (ref 3.8–4.9)
Alkaline Phosphatase: 116 IU/L (ref 44–121)
BUN/Creatinine Ratio: 14 (ref 9–23)
BUN: 12 mg/dL (ref 6–24)
Bilirubin Total: 0.2 mg/dL (ref 0.0–1.2)
CO2: 26 mmol/L (ref 20–29)
Calcium: 9.5 mg/dL (ref 8.7–10.2)
Chloride: 101 mmol/L (ref 96–106)
Creatinine, Ser: 0.86 mg/dL (ref 0.57–1.00)
Globulin, Total: 3.1 g/dL (ref 1.5–4.5)
Glucose: 102 mg/dL — ABNORMAL HIGH (ref 70–99)
Potassium: 4.7 mmol/L (ref 3.5–5.2)
Sodium: 138 mmol/L (ref 134–144)
Total Protein: 7.5 g/dL (ref 6.0–8.5)
eGFR: 78 mL/min/{1.73_m2} (ref >59)

## 2021-10-02 LAB — IRON,TIBC AND FERRITIN PANEL
Ferritin: 15 ng/mL (ref 15–150)
Iron Saturation: 12 % — ABNORMAL LOW (ref 15–55)
Iron: 45 ug/dL (ref 27–159)
Total Iron Binding Capacity: 376 ug/dL (ref 250–450)
UIBC: 331 ug/dL (ref 131–425)

## 2021-10-02 LAB — VITAMIN B12: Vitamin B-12: 774 pg/mL (ref 232–1245)

## 2021-10-02 LAB — TSH: TSH: 2.75 u[IU]/mL (ref 0.450–4.500)

## 2021-10-03 ENCOUNTER — Other Ambulatory Visit (HOSPITAL_COMMUNITY): Payer: Self-pay

## 2021-10-03 ENCOUNTER — Encounter: Payer: Self-pay | Admitting: Nurse Practitioner

## 2021-10-03 ENCOUNTER — Other Ambulatory Visit: Payer: Self-pay | Admitting: Nurse Practitioner

## 2021-10-03 DIAGNOSIS — E611 Iron deficiency: Secondary | ICD-10-CM

## 2021-10-03 MED ORDER — FERROUS SULFATE 325 (65 FE) MG PO TBEC
325.0000 mg | DELAYED_RELEASE_TABLET | Freq: Two times a day (BID) | ORAL | 2 refills | Status: DC
  Filled 2021-10-03: qty 60, 30d supply, fill #0

## 2021-10-04 ENCOUNTER — Other Ambulatory Visit (HOSPITAL_COMMUNITY): Payer: Self-pay

## 2021-10-07 ENCOUNTER — Encounter: Payer: Self-pay | Admitting: Nurse Practitioner

## 2021-10-22 DIAGNOSIS — Z1152 Encounter for screening for COVID-19: Secondary | ICD-10-CM | POA: Diagnosis not present

## 2021-11-07 DIAGNOSIS — Z1152 Encounter for screening for COVID-19: Secondary | ICD-10-CM | POA: Diagnosis not present

## 2021-11-21 DIAGNOSIS — Z1152 Encounter for screening for COVID-19: Secondary | ICD-10-CM | POA: Diagnosis not present

## 2021-12-17 ENCOUNTER — Encounter: Payer: Self-pay | Admitting: Internal Medicine

## 2021-12-17 ENCOUNTER — Ambulatory Visit (INDEPENDENT_AMBULATORY_CARE_PROVIDER_SITE_OTHER): Payer: 59 | Admitting: Internal Medicine

## 2021-12-17 ENCOUNTER — Other Ambulatory Visit (HOSPITAL_COMMUNITY)
Admission: RE | Admit: 2021-12-17 | Discharge: 2021-12-17 | Disposition: A | Discharge status: Home / Self Care | Payer: 59 | Source: Ambulatory Visit | Attending: Internal Medicine | Admitting: Internal Medicine

## 2021-12-17 ENCOUNTER — Other Ambulatory Visit: Payer: Self-pay

## 2021-12-17 VITALS — BP 120/72 | Temp 98.1°F | Ht 68.8 in | Wt 144.6 lb

## 2021-12-17 DIAGNOSIS — Z0000 Encounter for general adult medical examination without abnormal findings: Secondary | ICD-10-CM

## 2021-12-17 DIAGNOSIS — Z6821 Body mass index (BMI) 21.0-21.9, adult: Secondary | ICD-10-CM | POA: Diagnosis not present

## 2021-12-17 DIAGNOSIS — E559 Vitamin D deficiency, unspecified: Secondary | ICD-10-CM

## 2021-12-17 DIAGNOSIS — Z01419 Encounter for gynecological examination (general) (routine) without abnormal findings: Secondary | ICD-10-CM | POA: Diagnosis not present

## 2021-12-17 DIAGNOSIS — Z86010 Personal history of colonic polyps: Secondary | ICD-10-CM | POA: Diagnosis not present

## 2021-12-17 DIAGNOSIS — Z124 Encounter for screening for malignant neoplasm of cervix: Secondary | ICD-10-CM

## 2021-12-17 NOTE — Patient Instructions (Signed)

## 2021-12-17 NOTE — Progress Notes (Signed)
Rich Brave Llittleton,acting as a Education administrator for Maximino Greenland, MD.,have documented all relevant documentation on the behalf of Maximino Greenland, MD,as directed by  Maximino Greenland, MD while in the presence of Maximino Greenland, MD.  This visit occurred during the SARS-CoV-2 public health emergency.  Safety protocols were in place, including screening questions prior to the visit, additional usage of staff PPE, and extensive cleaning of exam room while observing appropriate contact time as indicated for disinfecting solutions.  Subjective:     Patient ID: Donna Ayers , female    DOB: 05/06/1961 , 61 y.o.   MRN: [id number]   Chief Complaint  Patient presents with   Annual Exam    HPI  She is here today for a full physical exam. She is not followed by GYN. She had pap smear performed 2020 and it was normal, HPV negative. She has no specific concerns or complaints at this time.  She reports she has been taking Metamucil twice daily since her last visit in hopes this would help with her cholesterol levels. She has not had any issues with this regimen.     Past Medical History:  Diagnosis Date   Leukopenia      Family History  Problem Relation Age of Onset   Diabetes Mother    Hypertension Father    Breast cancer Neg Hx      Current Outpatient Medications:    Cholecalciferol (VITAMIN D3) 50 MCG (2000 UT) capsule, Take 2,000 Units by mouth daily., Disp: , Rfl:    diclofenac Sodium (VOLTAREN) 1 % GEL, Apply 2 g topically 4 (four) times daily., Disp: 100 g, Rfl: 2   ferrous sulfate 325 (65 FE) MG EC tablet, Take 1 tablet (325 mg total) by mouth 2 (two) times daily., Disp: 60 tablet, Rfl: 2   Multiple Vitamins-Minerals (MULTIVITAL) CHEW, Chew by mouth. 2 gummies 2 times per day, Disp: , Rfl:    psyllium (METAMUCIL) 58.6 % powder, Take 1 packet by mouth in the morning and at bedtime., Disp: , Rfl:    Allergies  Allergen Reactions   Tramadol Other (See Comments)    vomiting      The  patient states she uses none for birth control. Last LMP was No LMP recorded. Patient is postmenopausal.. Negative for Dysmenorrhea. Negative for: breast discharge, breast lump(s), breast pain and breast self exam. Associated symptoms include abnormal vaginal bleeding. Pertinent negatives include abnormal bleeding (hematology), anxiety, decreased libido, depression, difficulty falling sleep, dyspareunia, history of infertility, nocturia, sexual dysfunction, sleep disturbances, urinary incontinence, urinary urgency, vaginal discharge and vaginal itching. Diet regular.The patient states her exercise level is  moderate.  . The patient's tobacco use is:  Social History   Tobacco Use  Smoking Status Never  Smokeless Tobacco Never  . She has been exposed to passive smoke. The patient's alcohol use is:  Social History   Substance and Sexual Activity  Alcohol Use Never   Review of Systems  Constitutional: Negative.   HENT: Negative.    Eyes: Negative.   Respiratory: Negative.    Cardiovascular: Negative.   Gastrointestinal: Negative.   Endocrine: Negative.   Genitourinary: Negative.   Musculoskeletal: Negative.   Skin: Negative.   Allergic/Immunologic: Negative.   Neurological: Negative.   Hematological: Negative.   Psychiatric/Behavioral: Negative.      Today's Vitals   12/17/21 1119  BP: 120/72  Temp: 98.1 F (36.7 C)  Weight: 144 lb 9.6 oz (65.6 kg)  Height: 5' 8.8" (1.748  m)   Body mass index is 21.48 kg/m.  Wt Readings from Last 3 Encounters:  12/17/21 144 lb 9.6 oz (65.6 kg)  10/01/21 151 lb 9.6 oz (68.8 kg)  12/12/20 152 lb (68.9 kg)    Objective:  Physical Exam Vitals and nursing note reviewed. Exam conducted with a chaperone present.  Constitutional:      Appearance: Normal appearance.  HENT:     Head: Normocephalic and atraumatic.     Right Ear: Tympanic membrane, ear canal and external ear normal.     Left Ear: Tympanic membrane, ear canal and external ear  normal.     Nose:     Comments: Masked     Mouth/Throat:     Comments: Masked  Eyes:     Extraocular Movements: Extraocular movements intact.     Conjunctiva/sclera: Conjunctivae normal.     Pupils: Pupils are equal, round, and reactive to light.  Cardiovascular:     Rate and Rhythm: Normal rate and regular rhythm.     Pulses: Normal pulses.     Heart sounds: Normal heart sounds.  Pulmonary:     Effort: Pulmonary effort is normal.     Breath sounds: Normal breath sounds.  Chest:  Breasts:    Tanner Score is 5.     Right: Normal.     Left: Normal.  Abdominal:     General: Abdomen is flat. Bowel sounds are normal.     Palpations: Abdomen is soft.  Genitourinary:    Exam position: Lithotomy position.     Pubic Area: No rash or pubic lice.      Tanner stage (genital): 5.     Labia:        Right: No tenderness.        Left: No tenderness.      Vagina: Normal.     Cervix: No lesion or erythema.     Uterus: Normal.      Comments: She declined rectal exam Stenotic cervix Musculoskeletal:        General: Normal range of motion.     Cervical back: Normal range of motion and neck supple.  Lymphadenopathy:     Lower Body: No right inguinal adenopathy. No left inguinal adenopathy.  Skin:    General: Skin is warm and dry.  Neurological:     General: No focal deficit present.     Mental Status: She is alert and oriented to person, place, and time.  Psychiatric:        Mood and Affect: Mood normal.        Behavior: Behavior normal.        Assessment And Plan:     1. Encounter for general adult medical examination w/o abnormal findings Comments: A full exam was performed. Importance of monthly self breast exams was discussed with the patient. PATIENT IS ADVISED TO GET 30-45 MINUTES REGULAR EXERCISE NO LESS THAN FOUR TO FIVE DAYS PER WEEK - BOTH WEIGHTBEARING EXERCISES AND AEROBIC ARE RECOMMENDED.  PATIENT IS ADVISED TO FOLLOW A HEALTHY DIET WITH AT LEAST SIX FRUITS/VEGGIES PER  DAY, DECREASE INTAKE OF RED MEAT, AND TO INCREASE FISH INTAKE TO TWO DAYS PER WEEK.  MEATS/FISH SHOULD NOT BE FRIED, BAKED OR BROILED IS PREFERABLE.  IT IS ALSO IMPORTANT TO CUT BACK ON YOUR SUGAR INTAKE. PLEASE AVOID ANYTHING WITH ADDED SUGAR, CORN SYRUP OR OTHER SWEETENERS. IF YOU MUST USE A SWEETENER, YOU CAN TRY STEVIA. IT IS ALSO IMPORTANT TO AVOID ARTIFICIALLY SWEETENERS AND DIET BEVERAGES. LASTLY,  I SUGGEST WEARING SPF 50 SUNSCREEN ON EXPOSED PARTS AND ESPECIALLY WHEN IN THE DIRECT SUNLIGHT FOR AN EXTENDED PERIOD OF TIME.  PLEASE AVOID FAST FOOD RESTAURANTS AND INCREASE YOUR WATER INTAKE.  - CBC - CMP14+EGFR - Lipid panel - Iron, TIBC and Ferritin Panel  2. Encntr for gyn exam (general) (routine) w/o abn findings Comments: Pap smear performed. She declined rectal exam. - Cytology -Pap Smear  3. Vitamin D insufficiency Comments: I will check vitamin D level and supplement as needed.   4. BMI 21.0-21.9, adult Comments: She has lost 7 lbs since Nov 2022. She is encouraged to continue with her regular exercise regimen.   5. Personal history of colonic polyps Comments: Last colonoscopy was in 2015. I will refer her to GI for CRC screening. PT declined DRE today. I will recheck iron levels which was low in Nov 2022.  - Ambulatory referral to Gastroenterology  Patient was given opportunity to ask questions. Patient verbalized understanding of the plan and was able to repeat key elements of the plan. All questions were answered to their satisfaction.   I, Maximino Greenland, MD, have reviewed all documentation for this visit. The documentation on 12/17/21 for the exam, diagnosis, procedures, and orders are all accurate and complete.  THE PATIENT IS ENCOURAGED TO PRACTICE SOCIAL DISTANCING DUE TO THE COVID-19 PANDEMIC.

## 2021-12-18 ENCOUNTER — Encounter: Payer: Self-pay | Admitting: Internal Medicine

## 2021-12-18 LAB — CMP14+EGFR
ALT: 17 IU/L (ref 0–32)
AST: 19 IU/L (ref 0–40)
Albumin/Globulin Ratio: 1.3 (ref 1.2–2.2)
Albumin: 4.1 g/dL (ref 3.8–4.9)
Alkaline Phosphatase: 109 IU/L (ref 44–121)
BUN/Creatinine Ratio: 14 (ref 9–23)
BUN: 12 mg/dL (ref 6–24)
Bilirubin Total: 0.3 mg/dL (ref 0.0–1.2)
CO2: 27 mmol/L (ref 20–29)
Calcium: 9.5 mg/dL (ref 8.7–10.2)
Chloride: 103 mmol/L (ref 96–106)
Creatinine, Ser: 0.83 mg/dL (ref 0.57–1.00)
Globulin, Total: 3.2 g/dL (ref 1.5–4.5)
Glucose: 78 mg/dL (ref 70–99)
Potassium: 4.4 mmol/L (ref 3.5–5.2)
Sodium: 140 mmol/L (ref 134–144)
Total Protein: 7.3 g/dL (ref 6.0–8.5)
eGFR: 82 mL/min/{1.73_m2} (ref >59)

## 2021-12-18 LAB — CBC
Hematocrit: 40.3 % (ref 34.0–46.6)
Hemoglobin: 13 g/dL (ref 11.1–15.9)
MCH: 29 pg (ref 26.6–33.0)
MCHC: 32.3 g/dL (ref 31.5–35.7)
MCV: 90 fL (ref 79–97)
Platelets: 266 10*3/uL (ref 150–450)
RBC: 4.48 x10E6/uL (ref 3.77–5.28)
RDW: 11.4 % — ABNORMAL LOW (ref 11.7–15.4)
WBC: 3.2 10*3/uL — ABNORMAL LOW (ref 3.4–10.8)

## 2021-12-18 LAB — IRON,TIBC AND FERRITIN PANEL
Ferritin: 20 ng/mL (ref 15–150)
Iron Saturation: 17 % (ref 15–55)
Iron: 67 ug/dL (ref 27–159)
Total Iron Binding Capacity: 393 ug/dL (ref 250–450)
UIBC: 326 ug/dL (ref 131–425)

## 2021-12-18 LAB — LIPID PANEL
Chol/HDL Ratio: 2.4 ratio (ref 0.0–4.4)
Cholesterol, Total: 176 mg/dL (ref 100–199)
HDL: 72 mg/dL (ref >39)
LDL Chol Calc (NIH): 93 mg/dL (ref 0–99)
Triglycerides: 54 mg/dL (ref 0–149)
VLDL Cholesterol Cal: 11 mg/dL (ref 5–40)

## 2021-12-20 ENCOUNTER — Telehealth: Payer: Self-pay

## 2021-12-20 LAB — CYTOLOGY - PAP
Comment: NEGATIVE
Diagnosis: NEGATIVE
High risk HPV: NEGATIVE

## 2021-12-20 NOTE — Telephone Encounter (Addendum)
Second attempt trying to reach pt. Lvm for pt to give the office a call, ext.[phone]. pt needs a 2 month lab apt scheduled.

## 2021-12-24 ENCOUNTER — Encounter: Payer: Self-pay | Admitting: Internal Medicine

## 2022-01-01 ENCOUNTER — Other Ambulatory Visit (INDEPENDENT_AMBULATORY_CARE_PROVIDER_SITE_OTHER): Payer: 59

## 2022-01-01 ENCOUNTER — Other Ambulatory Visit: Payer: Self-pay

## 2022-01-01 DIAGNOSIS — D6489 Other specified anemias: Secondary | ICD-10-CM

## 2022-01-01 LAB — POC HEMOCCULT BLD/STL (OFFICE/1-CARD/DIAGNOSTIC)
Card #1 Date: 2132023
Fecal Occult Blood, POC: NEGATIVE

## 2022-01-17 DIAGNOSIS — Z1152 Encounter for screening for COVID-19: Secondary | ICD-10-CM | POA: Diagnosis not present

## 2022-01-30 DIAGNOSIS — Z1152 Encounter for screening for COVID-19: Secondary | ICD-10-CM | POA: Diagnosis not present

## 2022-02-18 ENCOUNTER — Other Ambulatory Visit: Payer: 59

## 2022-02-18 DIAGNOSIS — D72819 Decreased white blood cell count, unspecified: Secondary | ICD-10-CM | POA: Diagnosis not present

## 2022-02-18 LAB — CBC WITH DIFFERENTIAL/PLATELET
Basophils Absolute: 0 10*3/uL (ref 0.0–0.2)
Basos: 1 %
EOS (ABSOLUTE): 0 10*3/uL (ref 0.0–0.4)
Eos: 1 %
Hematocrit: 36.7 % (ref 34.0–46.6)
Hemoglobin: 12.5 g/dL (ref 11.1–15.9)
Immature Grans (Abs): 0 10*3/uL (ref 0.0–0.1)
Immature Granulocytes: 0 %
Lymphocytes Absolute: 1.8 10*3/uL (ref 0.7–3.1)
Lymphs: 47 %
MCH: 29.8 pg (ref 26.6–33.0)
MCHC: 34.1 g/dL (ref 31.5–35.7)
MCV: 88 fL (ref 79–97)
Monocytes Absolute: 0.3 10*3/uL (ref 0.1–0.9)
Monocytes: 9 %
Neutrophils Absolute: 1.6 10*3/uL (ref 1.4–7.0)
Neutrophils: 42 %
Platelets: 230 10*3/uL (ref 150–450)
RBC: 4.19 x10E6/uL (ref 3.77–5.28)
RDW: 12.5 % (ref 11.7–15.4)
WBC: 3.7 10*3/uL (ref 3.4–10.8)

## 2022-02-21 ENCOUNTER — Other Ambulatory Visit: Payer: 59

## 2022-03-06 DIAGNOSIS — Z1152 Encounter for screening for COVID-19: Secondary | ICD-10-CM | POA: Diagnosis not present

## 2022-03-15 DIAGNOSIS — Z1152 Encounter for screening for COVID-19: Secondary | ICD-10-CM | POA: Diagnosis not present

## 2022-08-20 IMAGING — DX DG WRIST COMPLETE 3+V*L*
4 series · 5 of 5 positions shown · non-contrast
Comparison: None.

CLINICAL DATA: Left wrist pain.

EXAM:
LEFT WRIST - COMPLETE 3+ VIEW

[dg wrist complete left (1 of 4)]
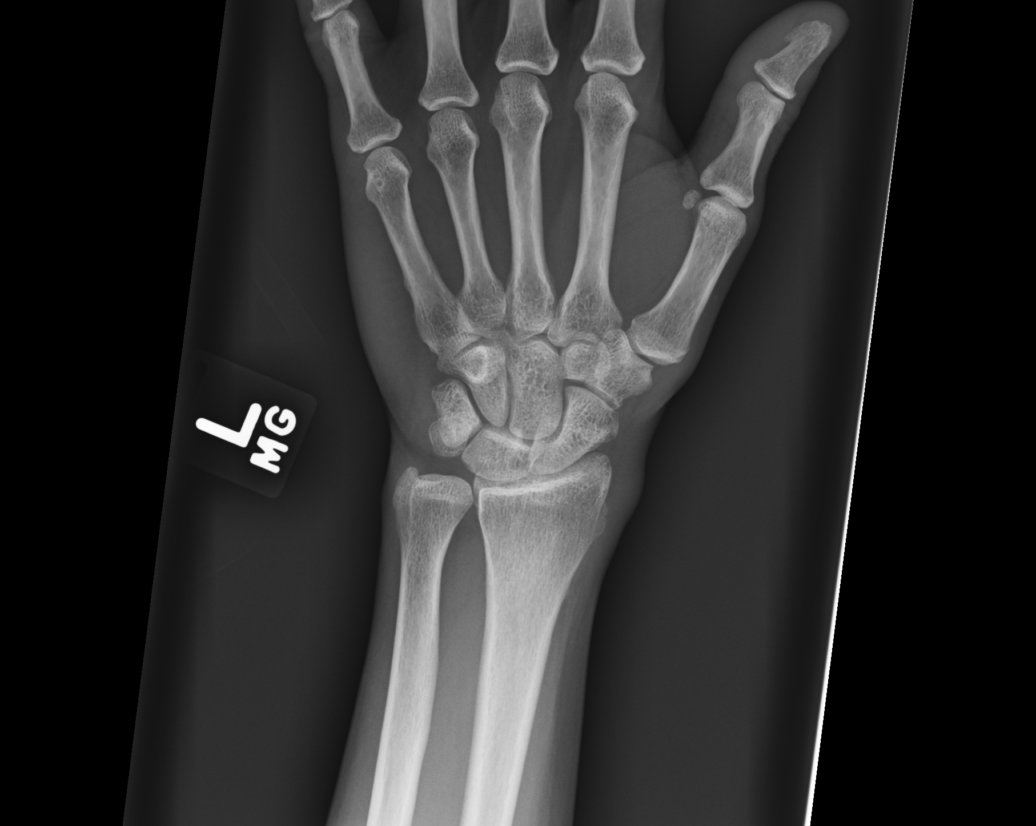

[dg wrist complete left (2 of 4)]
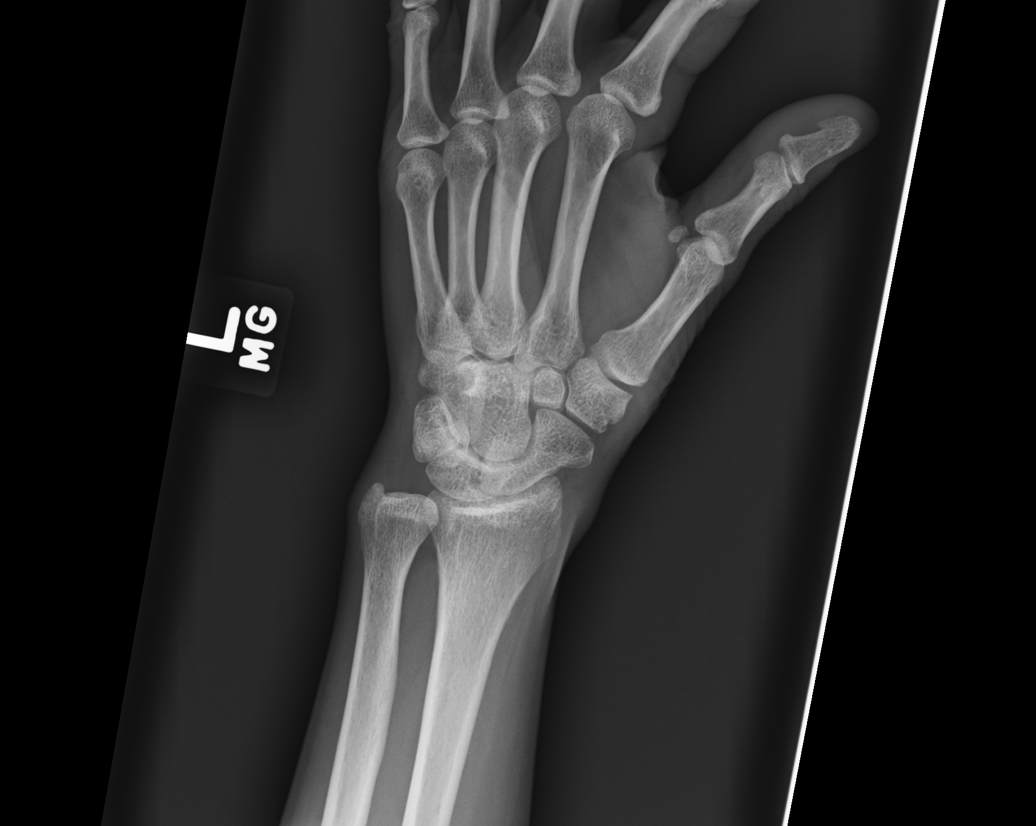

[dg wrist complete left (3 of 4)]
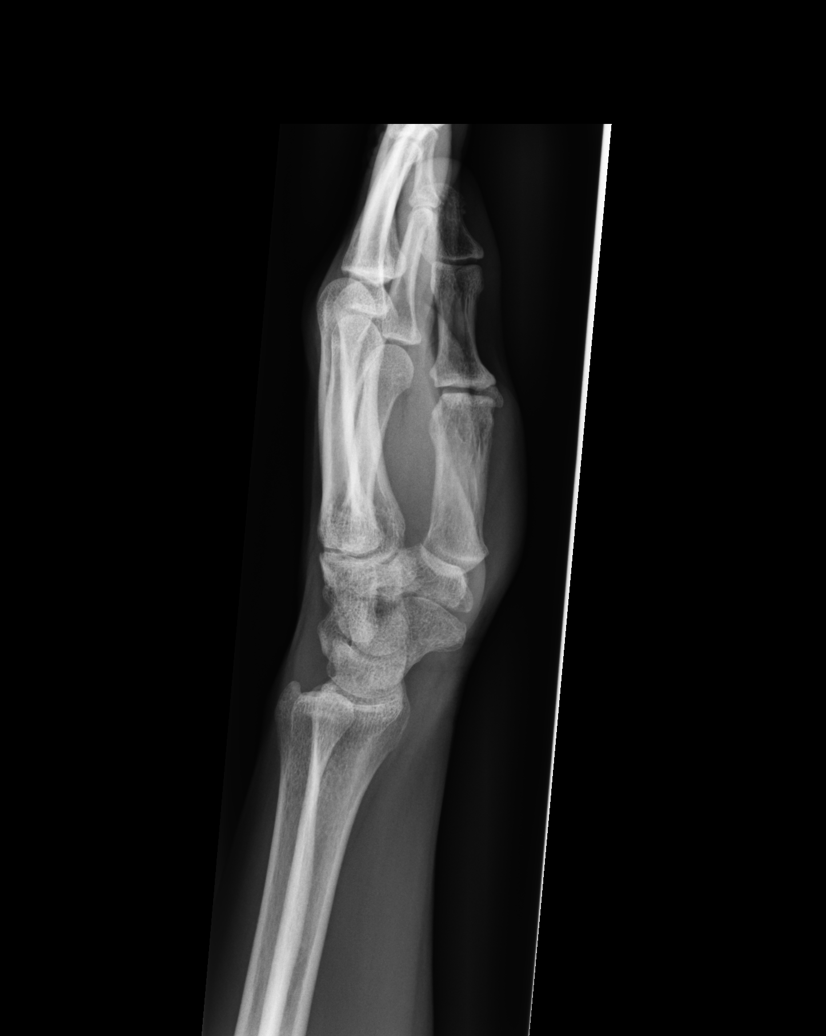

[Series 4: dg wrist complete left · left · 0.14mm/px · 2 of 2 slices shown (4 of 4)]
[im 1/2]
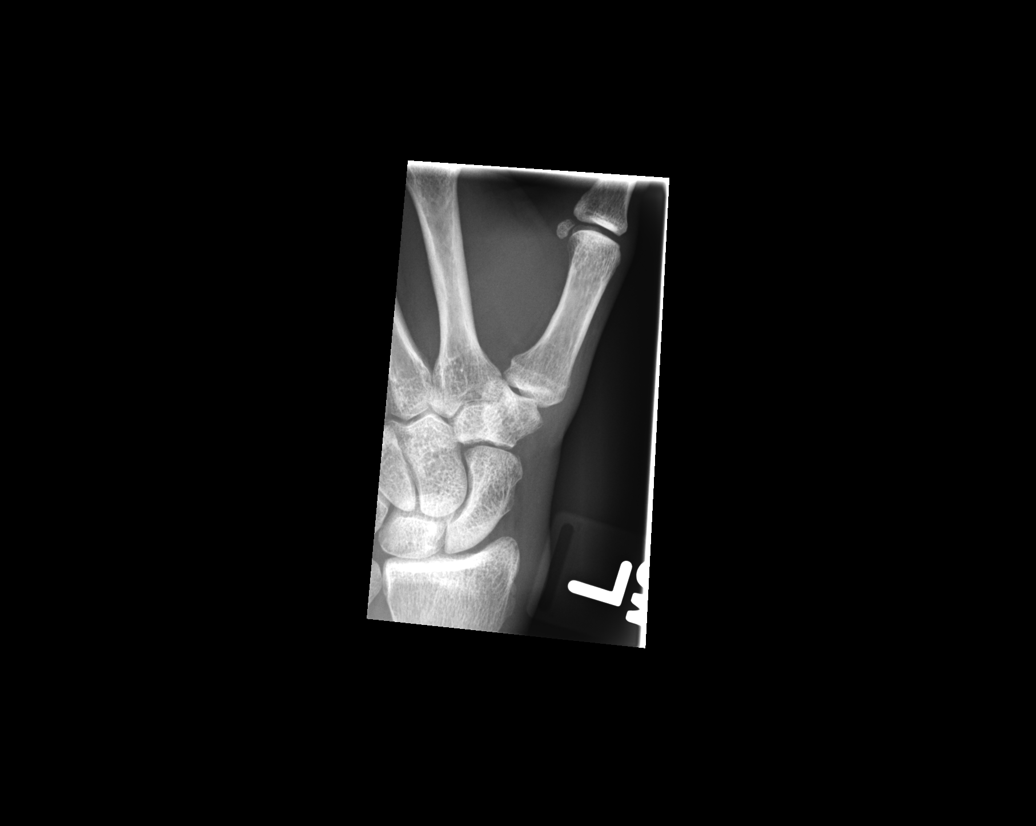
[im 2/2]
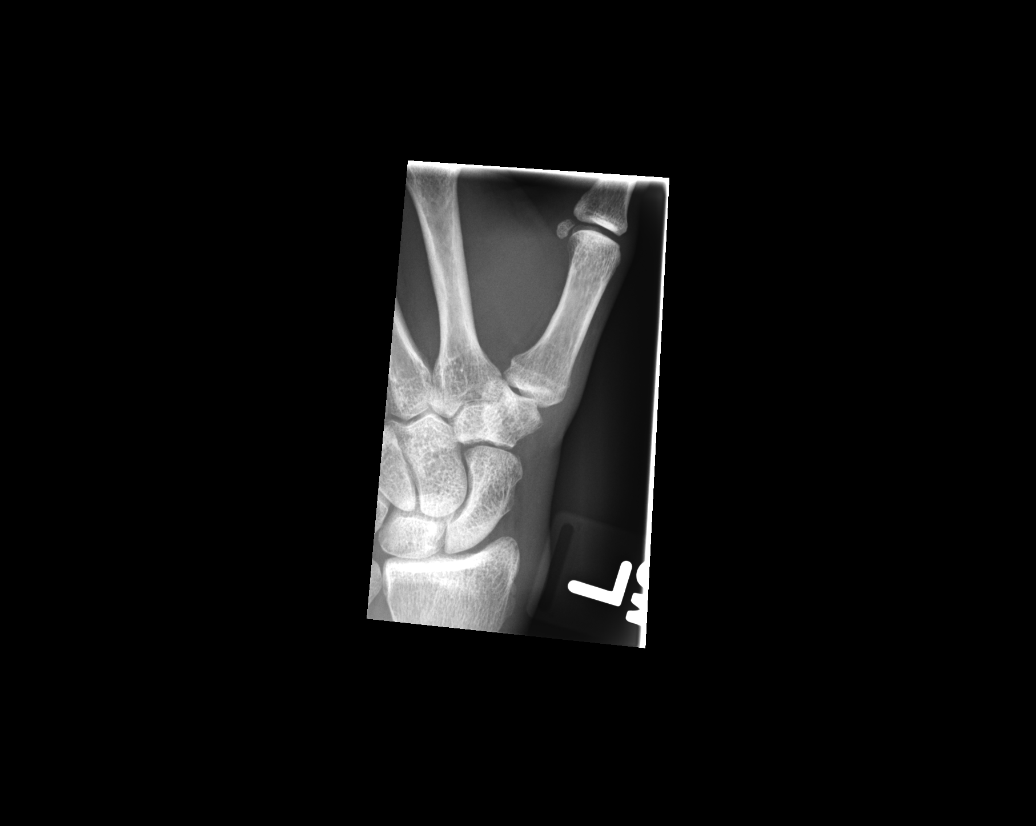

[5 of 5 positions shown; findings below may reference images not displayed]

FINDINGS: There is no evidence of fracture or dislocation. There is no
evidence of arthropathy or other focal bone abnormality. Soft
tissues are unremarkable.
IMPRESSION: Negative.

## 2022-08-22 ENCOUNTER — Other Ambulatory Visit: Payer: Self-pay | Admitting: Internal Medicine

## 2022-08-22 DIAGNOSIS — Z1231 Encounter for screening mammogram for malignant neoplasm of breast: Secondary | ICD-10-CM

## 2022-09-13 ENCOUNTER — Other Ambulatory Visit: Payer: Self-pay

## 2022-09-13 MED ORDER — COVID-19 MRNA 2023-2024 VACCINE (COMIRNATY) 0.3 ML INJECTION
0.3000 mL | Freq: Once | INTRAMUSCULAR | 0 refills | Status: AC
  Filled 2022-09-13: qty 0.3, 1d supply, fill #0

## 2022-09-20 ENCOUNTER — Other Ambulatory Visit (HOSPITAL_BASED_OUTPATIENT_CLINIC_OR_DEPARTMENT_OTHER): Payer: Self-pay

## 2022-09-30 ENCOUNTER — Ambulatory Visit
Admission: RE | Admit: 2022-09-30 | Discharge: 2022-09-30 | Disposition: A | Discharge status: Home / Self Care | Payer: 59 | Source: Ambulatory Visit | Attending: Internal Medicine | Admitting: Internal Medicine

## 2022-09-30 DIAGNOSIS — Z1231 Encounter for screening mammogram for malignant neoplasm of breast: Secondary | ICD-10-CM

## 2022-12-30 ENCOUNTER — Other Ambulatory Visit: Payer: Self-pay | Admitting: Internal Medicine

## 2022-12-30 ENCOUNTER — Other Ambulatory Visit (HOSPITAL_COMMUNITY): Payer: Self-pay

## 2023-01-01 ENCOUNTER — Other Ambulatory Visit (HOSPITAL_COMMUNITY): Payer: Self-pay

## 2023-01-01 NOTE — Telephone Encounter (Signed)
  Last ordered: 2 years ago (08/30/2020) by Glendale Chard, MD    Last dispensed: 08/30/2020    Please review, thank you!

## 2023-01-02 ENCOUNTER — Other Ambulatory Visit (HOSPITAL_COMMUNITY): Payer: Self-pay

## 2023-01-02 ENCOUNTER — Ambulatory Visit (INDEPENDENT_AMBULATORY_CARE_PROVIDER_SITE_OTHER): Payer: Commercial Managed Care - PPO | Admitting: Internal Medicine

## 2023-01-02 ENCOUNTER — Encounter: Payer: Self-pay | Admitting: Internal Medicine

## 2023-01-02 VITALS — BP 120/70 | HR 98 | Temp 98.1°F | Ht 68.0 in | Wt 150.4 lb

## 2023-01-02 DIAGNOSIS — Z2821 Immunization not carried out because of patient refusal: Secondary | ICD-10-CM

## 2023-01-02 DIAGNOSIS — R202 Paresthesia of skin: Secondary | ICD-10-CM

## 2023-01-02 DIAGNOSIS — Z0000 Encounter for general adult medical examination without abnormal findings: Secondary | ICD-10-CM

## 2023-01-02 DIAGNOSIS — M25562 Pain in left knee: Secondary | ICD-10-CM | POA: Diagnosis not present

## 2023-01-02 DIAGNOSIS — E559 Vitamin D deficiency, unspecified: Secondary | ICD-10-CM

## 2023-01-02 MED ORDER — FLUOCINOLONE ACETONIDE SCALP 0.01 % EX OIL
1.0000 | TOPICAL_OIL | Freq: Every day | CUTANEOUS | 1 refills | Status: DC
  Filled 2023-01-02: qty 118.28, 30d supply, fill #0

## 2023-01-02 MED ORDER — DICLOFENAC SODIUM 1 % EX GEL
2.0000 g | Freq: Four times a day (QID) | CUTANEOUS | 2 refills | Status: AC
  Filled 2023-01-02: qty 100, 5d supply, fill #0

## 2023-01-02 NOTE — Progress Notes (Signed)
Barnet Glasgow Martin,acting as a Education administrator for Maximino Greenland, MD.,have documented all relevant documentation on the behalf of Maximino Greenland, MD,as directed by  Maximino Greenland, MD while in the presence of Maximino Greenland, MD.   Subjective:     Patient ID: Donna Ayers , female    DOB: 1961-08-27 , 62 y.o.   MRN: [id number]   Chief Complaint  Patient presents with   Annual Exam    HPI  She is here today for a full physical exam. She is not followed by GYN. She had pap smear performed 12/17/2021 and it was normal, HPV negative.   Patient states she is having pain in her left knee.  Denies fall/trauma. Described as a dull pain. Knee pain is exacerbated by ambulation. Sx started a couple of months ago.  BP Readings from Last 3 Encounters: 01/02/23 : 120/70 12/17/21 : 120/72 10/01/21 : 120/70       Past Medical History:  Diagnosis Date   Leukopenia      Family History  Problem Relation Age of Onset   Diabetes Mother    Hypertension Father    Breast cancer Neg Hx      Current Outpatient Medications:    Cholecalciferol (VITAMIN D3) 50 MCG (2000 UT) capsule, Take 2,000 Units by mouth daily., Disp: , Rfl:    Multiple Vitamins-Minerals (MULTIVITAL) CHEW, Chew by mouth. 2 gummies 2 times per day, Disp: , Rfl:    psyllium (METAMUCIL) 58.6 % powder, Take 1 packet by mouth in the morning and at bedtime., Disp: , Rfl:    diclofenac Sodium (VOLTAREN) 1 % GEL, Apply 2 g topically 4 (four) times daily., Disp: 100 g, Rfl: 2   ferrous sulfate 325 (65 FE) MG EC tablet, Take 1 tablet (325 mg total) by mouth 2 (two) times daily. (Patient not taking: Reported on 01/02/2023), Disp: 60 tablet, Rfl: 2   Fluocinolone Acetonide Scalp 0.01 % OIL, APPLY TO SCALP AFTER SHAMPOO, Disp: 118.28 mL, Rfl: 1   Allergies  Allergen Reactions   Tramadol Other (See Comments)    vomiting      The patient states she uses post menopausal status for birth control. Last LMP was No LMP recorded. Patient is  postmenopausal.. Negative for Dysmenorrhea. Negative for: breast discharge, breast lump(s), breast pain and breast self exam. Associated symptoms include abnormal vaginal bleeding. Pertinent negatives include abnormal bleeding (hematology), anxiety, decreased libido, depression, difficulty falling sleep, dyspareunia, history of infertility, nocturia, sexual dysfunction, sleep disturbances, urinary incontinence, urinary urgency, vaginal discharge and vaginal itching. Diet regular.The patient states her exercise level is    . The patient's tobacco use is:  Social History   Tobacco Use  Smoking Status Never  Smokeless Tobacco Never  . She has been exposed to passive smoke. The patient's alcohol use is:  Social History   Substance and Sexual Activity  Alcohol Use Never    Review of Systems  Constitutional: Negative.   HENT: Negative.    Eyes: Negative.   Respiratory: Negative.    Cardiovascular: Negative.   Gastrointestinal: Negative.   Endocrine: Negative.   Genitourinary: Negative.   Musculoskeletal:  Positive for arthralgias.       She c/o left knee pain. She has issues with bending. There is more pain with walking up/down steps. She has tried Aleve without any improvement.   Skin: Negative.   Allergic/Immunologic: Negative.   Neurological:  Positive for numbness.       She c/o left toe numbness.  Did not stub her toe. Denies fall/trauma. Sx started several weeks ago. Denies LBP.   Hematological: Negative.   Psychiatric/Behavioral: Negative.       Today's Vitals   01/02/23 1057  BP: 120/70  Pulse: 98  Temp: 98.1 F (36.7 C)  TempSrc: Oral  Weight: 150 lb 6.4 oz (68.2 kg)  Height: 5' 8"$  (1.727 m)  PainSc: 6   PainLoc: Leg   Body mass index is 22.87 kg/m.  Wt Readings from Last 3 Encounters:  01/02/23 150 lb 6.4 oz (68.2 kg)  12/17/21 144 lb 9.6 oz (65.6 kg)  10/01/21 151 lb 9.6 oz (68.8 kg)    Objective:  Physical Exam Constitutional:      General: She is not in  acute distress.    Appearance: Normal appearance. She is well-developed. She is obese.  HENT:     Head: Normocephalic and atraumatic.     Right Ear: Hearing, tympanic membrane, ear canal and external ear normal. There is no impacted cerumen.     Left Ear: Hearing, tympanic membrane, ear canal and external ear normal. There is no impacted cerumen.     Nose:     Comments: Deferred - masked    Mouth/Throat:     Comments: Deferred - masked Eyes:     General: Lids are normal.     Extraocular Movements: Extraocular movements intact.     Conjunctiva/sclera: Conjunctivae normal.     Pupils: Pupils are equal, round, and reactive to light.     Funduscopic exam:    Right eye: No papilledema.        Left eye: No papilledema.  Neck:     Thyroid: No thyroid mass.     Vascular: No carotid bruit.  Cardiovascular:     Rate and Rhythm: Normal rate and regular rhythm.     Pulses: Normal pulses.          Dorsalis pedis pulses are 2+ on the right side and 2+ on the left side.     Heart sounds: Normal heart sounds. No murmur heard. Pulmonary:     Effort: Pulmonary effort is normal.     Breath sounds: Normal breath sounds.  Chest:     Chest wall: No mass.  Breasts:    Tanner Score is 5.     Right: Normal. No mass or tenderness.     Left: Normal. No mass or tenderness.  Abdominal:     General: Abdomen is flat. Bowel sounds are normal. There is no distension.     Palpations: Abdomen is soft.     Tenderness: There is no abdominal tenderness.  Genitourinary:    Comments: Deferred  Musculoskeletal:        General: No swelling. Normal range of motion.     Cervical back: Full passive range of motion without pain, normal range of motion and neck supple.     Right lower leg: No edema.     Left lower leg: No edema.     Comments: B/l crepitus L>R  Feet:     Comments: Nl capillary refill Lymphadenopathy:     Upper Body:     Right upper body: No supraclavicular, axillary or pectoral adenopathy.      Left upper body: No supraclavicular, axillary or pectoral adenopathy.  Skin:    General: Skin is warm and dry.     Capillary Refill: Capillary refill takes less than 2 seconds.  Neurological:     General: No focal deficit present.  Mental Status: She is alert and oriented to person, place, and time.     Cranial Nerves: No cranial nerve deficit.     Sensory: No sensory deficit.  Psychiatric:        Mood and Affect: Mood normal.        Behavior: Behavior normal.        Thought Content: Thought content normal.        Judgment: Judgment normal.         Assessment And Plan:     1. Encounter for general adult medical examination w/o abnormal findings Comments: A full exam was performed. Importance of monthly self breast exams was discussed with the patient.  PATIENT IS ADVISED TO GET 30-45 MINUTES REGULAR EXERCISE NO LESS THAN FOUR TO FIVE DAYS PER WEEK - BOTH WEIGHTBEARING EXERCISES AND AEROBIC ARE RECOMMENDED.  PATIENT IS ADVISED TO FOLLOW A HEALTHY DIET WITH AT LEAST SIX FRUITS/VEGGIES PER DAY, DECREASE INTAKE OF RED MEAT, AND TO INCREASE FISH INTAKE TO TWO DAYS PER WEEK.  MEATS/FISH SHOULD NOT BE FRIED, BAKED OR BROILED IS PREFERABLE.  IT IS ALSO IMPORTANT TO CUT BACK ON YOUR SUGAR INTAKE. PLEASE AVOID ANYTHING WITH ADDED SUGAR, CORN SYRUP OR OTHER SWEETENERS. IF YOU MUST USE A SWEETENER, YOU CAN TRY STEVIA. IT IS ALSO IMPORTANT TO AVOID ARTIFICIALLY SWEETENERS AND DIET BEVERAGES. LASTLY, I SUGGEST WEARING SPF 50 SUNSCREEN ON EXPOSED PARTS AND ESPECIALLY WHEN IN THE DIRECT SUNLIGHT FOR AN EXTENDED PERIOD OF TIME.  PLEASE AVOID FAST FOOD RESTAURANTS AND INCREASE YOUR WATER INTAKE. - CBC - CMP14+EGFR - Lipid panel  2. Acute pain of left knee - diclofenac Sodium (VOLTAREN) 1 % GEL; Apply 2 g topically 4 (four) times daily.  Dispense: 100 g; Refill: 2 - Ambulatory referral to Orthopedic Surgery  3. Paresthesia Comments: I will check labs as below. - TSH - Vitamin B12  4. Vitamin D  insufficiency Comments: I will check vitamin D level and supplement as needed. - Vitamin D (25 hydroxy)  5. Herpes zoster vaccination declined   Patient was given opportunity to ask questions. Patient verbalized understanding of the plan and was able to repeat key elements of the plan. All questions were answered to their satisfaction.   I, Maximino Greenland, MD, have reviewed all documentation for this visit. The documentation on 01/02/23 for the exam, diagnosis, procedures, and orders are all accurate and complete.   THE PATIENT IS ENCOURAGED TO PRACTICE SOCIAL DISTANCING DUE TO THE COVID-19 PANDEMIC.

## 2023-01-02 NOTE — Patient Instructions (Signed)

## 2023-01-03 ENCOUNTER — Other Ambulatory Visit (HOSPITAL_COMMUNITY): Payer: Self-pay

## 2023-01-03 LAB — CMP14+EGFR
ALT: 20 IU/L (ref 0–32)
AST: 22 IU/L (ref 0–40)
Albumin/Globulin Ratio: 1.4 (ref 1.2–2.2)
Albumin: 4.5 g/dL (ref 3.8–4.9)
Alkaline Phosphatase: 110 IU/L (ref 44–121)
BUN/Creatinine Ratio: 15 (ref 12–28)
BUN: 11 mg/dL (ref 8–27)
Bilirubin Total: 0.4 mg/dL (ref 0.0–1.2)
CO2: 25 mmol/L (ref 20–29)
Calcium: 9.8 mg/dL (ref 8.7–10.3)
Chloride: 102 mmol/L (ref 96–106)
Creatinine, Ser: 0.74 mg/dL (ref 0.57–1.00)
Globulin, Total: 3.2 g/dL (ref 1.5–4.5)
Glucose: 87 mg/dL (ref 70–99)
Potassium: 4.4 mmol/L (ref 3.5–5.2)
Sodium: 140 mmol/L (ref 134–144)
Total Protein: 7.7 g/dL (ref 6.0–8.5)
eGFR: 93 mL/min/{1.73_m2} (ref >59)

## 2023-01-03 LAB — CBC
Hematocrit: 40.1 % (ref 34.0–46.6)
Hemoglobin: 13.5 g/dL (ref 11.1–15.9)
MCH: 30.9 pg (ref 26.6–33.0)
MCHC: 33.7 g/dL (ref 31.5–35.7)
MCV: 92 fL (ref 79–97)
Platelets: 248 10*3/uL (ref 150–450)
RBC: 4.37 x10E6/uL (ref 3.77–5.28)
RDW: 11.6 % — ABNORMAL LOW (ref 11.7–15.4)
WBC: 3.9 10*3/uL (ref 3.4–10.8)

## 2023-01-03 LAB — LIPID PANEL
Chol/HDL Ratio: 2.4 ratio (ref 0.0–4.4)
Cholesterol, Total: 186 mg/dL (ref 100–199)
HDL: 77 mg/dL (ref >39)
LDL Chol Calc (NIH): 100 mg/dL — ABNORMAL HIGH (ref 0–99)
Triglycerides: 44 mg/dL (ref 0–149)
VLDL Cholesterol Cal: 9 mg/dL (ref 5–40)

## 2023-01-03 LAB — TSH: TSH: 1.03 u[IU]/mL (ref 0.450–4.500)

## 2023-01-03 LAB — VITAMIN D 25 HYDROXY (VIT D DEFICIENCY, FRACTURES): Vit D, 25-Hydroxy: 57.8 ng/mL (ref 30.0–100.0)

## 2023-01-03 LAB — VITAMIN B12: Vitamin B-12: 1029 pg/mL (ref 232–1245)

## 2023-01-23 ENCOUNTER — Other Ambulatory Visit (HOSPITAL_COMMUNITY): Payer: Self-pay

## 2023-01-23 ENCOUNTER — Ambulatory Visit (INDEPENDENT_AMBULATORY_CARE_PROVIDER_SITE_OTHER): Payer: Commercial Managed Care - PPO

## 2023-01-23 ENCOUNTER — Encounter: Payer: Self-pay | Admitting: Orthopaedic Surgery

## 2023-01-23 ENCOUNTER — Ambulatory Visit: Payer: Commercial Managed Care - PPO | Admitting: Orthopaedic Surgery

## 2023-01-23 DIAGNOSIS — M1712 Unilateral primary osteoarthritis, left knee: Secondary | ICD-10-CM

## 2023-01-23 MED ORDER — DICLOFENAC SODIUM 75 MG PO TBEC
75.0000 mg | DELAYED_RELEASE_TABLET | Freq: Two times a day (BID) | ORAL | 2 refills | Status: AC
  Filled 2023-01-23: qty 30, 15d supply, fill #0

## 2023-01-23 NOTE — Progress Notes (Signed)
Office Visit Note   Patient: Donna Ayers           Date of Birth: 1961-10-18           MRN: [id number] Visit Date: 01/23/2023              Requested by: Donna Ayers, [address] [place],   [id number] PCP: Donna Ayers   Assessment & Plan: Visit Diagnoses:  1. Primary osteoarthritis of left knee     Plan: 62 year old female with left knee pain for about 2 months.  Impression is osteoarthritis flare.  Treatment options were discussed and she would like to try a 2-week course of diclofenac and we will provide her with an OA reaction brace.  She will follow-up if symptoms persist and she is interested in a steroid injection.  Follow-Up Instructions: No follow-ups on file.   Orders:  Orders Placed This Encounter  Procedures   XR KNEE 3 VIEW LEFT   Meds ordered this encounter  Medications   diclofenac (VOLTAREN) 75 MG EC tablet    Sig: Take 1 tablet (75 mg total) by mouth 2 (two) times daily.    Dispense:  30 tablet    Refill:  2      Procedures: No procedures performed   Clinical Data: No additional findings.   Subjective: Chief Complaint  Patient presents with   Left Knee - Pain    HPI  Donna Ayers comes in today for left knee pain since December.  No injury.  Voltaren gel helps.  Has pain around the kneecap and pain after sitting long periods of time.  Denies any mechanical symptoms.  Review of Systems  Constitutional: Negative.   HENT: Negative.    Eyes: Negative.   Respiratory: Negative.    Cardiovascular: Negative.   Endocrine: Negative.   Musculoskeletal: Negative.   Neurological: Negative.   Hematological: Negative.   Psychiatric/Behavioral: Negative.    All other systems reviewed and are negative.    Objective: Vital Signs: There were no vitals taken for this visit.  Physical Exam Vitals and nursing note reviewed.  Constitutional:      Appearance: She is well-developed.  HENT:     Head: Atraumatic.      Nose: Nose normal.  Eyes:     Extraocular Movements: Extraocular movements intact.  Cardiovascular:     Pulses: Normal pulses.  Pulmonary:     Effort: Pulmonary effort is normal.  Abdominal:     Palpations: Abdomen is soft.  Musculoskeletal:     Cervical back: Neck supple.  Skin:    General: Skin is warm.     Capillary Refill: Capillary refill takes less than 2 seconds.  Neurological:     Mental Status: She is alert. Mental status is at baseline.  Psychiatric:        Behavior: Behavior normal.        Thought Content: Thought content normal.        Judgment: Judgment normal.     Ortho Exam  Examination shows no joint effusion or joint line tenderness.  Collaterals and cruciates are stable.  Mild patellofemoral crepitus with range of motion.  Specialty Comments:  No specialty comments available.  Imaging: No results found.   PMFS History: Patient Active Problem List   Diagnosis Date Noted   Lymphadenopathy 03/08/2019   Hx of colonic polyp 11/30/2018   Past Medical History:  Diagnosis Date   Leukopenia     Family History  Problem  Relation Age of Onset   Diabetes Mother    Hypertension Father    Breast cancer Neg Hx     Past Surgical History:  Procedure Laterality Date   laceration left hand Left 1995   Social History   Occupational History   Not on file  Tobacco Use   Smoking status: Never   Smokeless tobacco: Never  Vaping Use   Vaping Use: Never used  Substance and Sexual Activity   Alcohol use: Never   Drug use: Never   Sexual activity: Not on file

## 2023-01-29 ENCOUNTER — Telehealth: Payer: Self-pay | Admitting: Orthopaedic Surgery

## 2023-01-29 NOTE — Telephone Encounter (Signed)
Patient called and stated that when she was last here she was prescribed diclofenac tablet. She took for a few days and noticed a rash appear. She has since discontinued this medication. She would like to discuss either a new medication or to possibly get the injection that was discussed last visit.  [phone]

## 2023-01-29 NOTE — Telephone Encounter (Signed)
I would assume this would be a cortisone injection.  Happy to have her come in for this

## 2023-01-30 NOTE — Telephone Encounter (Signed)
Called. No answer. LMOM for patient to call us to schedule appointment for injection with Lindsey/ Dr.Xu.

## 2023-02-07 ENCOUNTER — Ambulatory Visit: Payer: Commercial Managed Care - PPO | Admitting: Orthopaedic Surgery

## 2023-02-07 ENCOUNTER — Encounter: Payer: Self-pay | Admitting: Orthopaedic Surgery

## 2023-02-07 DIAGNOSIS — M25562 Pain in left knee: Secondary | ICD-10-CM

## 2023-02-07 DIAGNOSIS — M1712 Unilateral primary osteoarthritis, left knee: Secondary | ICD-10-CM | POA: Diagnosis not present

## 2023-02-07 MED ORDER — METHYLPREDNISOLONE ACETATE 40 MG/ML IJ SUSP
40.0000 mg | INTRAMUSCULAR | Status: AC | PRN
  Administered 2023-02-07: 40 mg via INTRA_ARTICULAR

## 2023-02-07 MED ORDER — BUPIVACAINE HCL 0.5 % IJ SOLN
2.0000 mL | INTRAMUSCULAR | Status: AC | PRN
  Administered 2023-02-07: 2 mL via INTRA_ARTICULAR

## 2023-02-07 MED ORDER — LIDOCAINE HCL 1 % IJ SOLN
2.0000 mL | INTRAMUSCULAR | Status: AC | PRN
  Administered 2023-02-07: 2 mL

## 2023-02-07 NOTE — Progress Notes (Signed)
Office Visit Note   Patient: Donna Ayers           Date of Birth: [birth year]           MRN: [id number] Visit Date: 02/07/2023              Requested by: Glendale Chard, [address] [place],  [place] [id number] PCP: Glendale Chard, MD   Assessment & Plan: Visit Diagnoses:  1. Primary osteoarthritis of left knee     Plan: 62 year old female with chronic left knee pain impression osteoarthritis.  At this point she would like to try a steroid injection.  She will also try Voltaren gel.  All questions answered to her satisfaction.  Follow-up as needed.  Follow-Up Instructions: No follow-ups on file.   Orders:  No orders of the defined types were placed in this encounter.  No orders of the defined types were placed in this encounter.     Procedures: Large Joint Inj: L knee on 02/07/2023 3:34 PM Details: 22 G needle Medications: 2 mL bupivacaine 0.5 %; 2 mL lidocaine 1 %; 40 mg methylPREDNISolone acetate 40 MG/ML Outcome: tolerated well, no immediate complications Patient was prepped and draped in the usual sterile fashion.       Clinical Data: No additional findings.   Subjective: Chief Complaint  Patient presents with   Left Knee - Pain    HPI  [name] returns today for follow-up of left knee osteoarthritis.  The diclofenac made her break out so she was unable to complete the 2-week course.  She has other questions as well.  Review of Systems  Constitutional: Negative.   HENT: Negative.    Eyes: Negative.   Respiratory: Negative.    Cardiovascular: Negative.   Endocrine: Negative.   Musculoskeletal: Negative.   Neurological: Negative.   Hematological: Negative.   Psychiatric/Behavioral: Negative.    All other systems reviewed and are negative.    Objective: Vital Signs: There were no vitals taken for this visit.  Physical Exam Vitals and nursing note reviewed.  Constitutional:      Appearance: She is well-developed.  HENT:      Head: Normocephalic and atraumatic.  Pulmonary:     Effort: Pulmonary effort is normal.  Abdominal:     Palpations: Abdomen is soft.  Musculoskeletal:     Cervical back: Neck supple.  Skin:    General: Skin is warm.     Capillary Refill: Capillary refill takes less than 2 seconds.  Neurological:     Mental Status: She is alert and oriented to person, place, and time.  Psychiatric:        Behavior: Behavior normal.        Thought Content: Thought content normal.        Judgment: Judgment normal.     Ortho Exam  Examination left knee shows no joint effusion.  Specialty Comments:  No specialty comments available.  Imaging: No results found.   PMFS History: Patient Active Problem List   Diagnosis Date Noted   Lymphadenopathy 03/08/2019   Hx of colonic polyp 11/30/2018   Past Medical History:  Diagnosis Date   Leukopenia     Family History  Problem Relation Age of Onset   Diabetes Mother    Hypertension Father    Breast cancer Neg Hx     Past Surgical History:  Procedure Laterality Date   laceration left hand Left 1995   Social History   Occupational History   Not on  file  Tobacco Use   Smoking status: Never   Smokeless tobacco: Never  Vaping Use   Vaping Use: Never used  Substance and Sexual Activity   Alcohol use: Never   Drug use: Never   Sexual activity: Not on file

## 2023-04-22 ENCOUNTER — Encounter: Payer: Self-pay | Admitting: Nurse Practitioner

## 2023-04-22 ENCOUNTER — Other Ambulatory Visit (HOSPITAL_COMMUNITY): Payer: Self-pay

## 2023-04-22 ENCOUNTER — Ambulatory Visit: Payer: Commercial Managed Care - PPO | Admitting: Nurse Practitioner

## 2023-04-22 VITALS — BP 110/80 | HR 75 | Temp 98.1°F | Ht 68.0 in | Wt 149.0 lb

## 2023-04-22 DIAGNOSIS — K640 First degree hemorrhoids: Secondary | ICD-10-CM | POA: Insufficient documentation

## 2023-04-22 DIAGNOSIS — Z6822 Body mass index (BMI) 22.0-22.9, adult: Secondary | ICD-10-CM

## 2023-04-22 DIAGNOSIS — R634 Abnormal weight loss: Secondary | ICD-10-CM | POA: Diagnosis not present

## 2023-04-22 DIAGNOSIS — Z2821 Immunization not carried out because of patient refusal: Secondary | ICD-10-CM

## 2023-04-22 LAB — CBC WITH DIFFERENTIAL/PLATELET
Basophils Absolute: 0 10*3/uL (ref 0.0–0.2)
Basos: 1 %
EOS (ABSOLUTE): 0 10*3/uL (ref 0.0–0.4)
Eos: 1 %
Hematocrit: 41.7 % (ref 34.0–46.6)
Hemoglobin: 13.7 g/dL (ref 11.1–15.9)
Immature Grans (Abs): 0 10*3/uL (ref 0.0–0.1)
Immature Granulocytes: 1 %
Lymphocytes Absolute: 1.5 10*3/uL (ref 0.7–3.1)
Lymphs: 43 %
MCH: 29.9 pg (ref 26.6–33.0)
MCHC: 32.9 g/dL (ref 31.5–35.7)
MCV: 91 fL (ref 79–97)
Monocytes Absolute: 0.4 10*3/uL (ref 0.1–0.9)
Monocytes: 11 %
Neutrophils Absolute: 1.6 10*3/uL (ref 1.4–7.0)
Neutrophils: 43 %
Platelets: 250 10*3/uL (ref 150–450)
RBC: 4.58 x10E6/uL (ref 3.77–5.28)
RDW: 11.6 % — ABNORMAL LOW (ref 11.7–15.4)
WBC: 3.6 10*3/uL (ref 3.4–10.8)

## 2023-04-22 MED ORDER — HYDROCORTISONE ACETATE 25 MG RE SUPP
25.0000 mg | Freq: Two times a day (BID) | RECTAL | 1 refills | Status: DC | PRN
Start: 2023-04-22 — End: 2024-08-18
  Filled 2023-04-22: qty 12, 6d supply, fill #0

## 2023-04-22 NOTE — Patient Instructions (Signed)
Hemorrhoids Hemorrhoids are swollen veins in and around the rectum or the opening of the butt (anus). There are two types of hemorrhoids: Internal. These occur in the veins just inside the rectum. They may poke through to the outside and become irritated and painful. External. These occur in the veins outside the anus. They can be felt as a painful swelling or hard lump near the anus. Most hemorrhoids do not cause severe problems. Often, they can be treated at home with diet and lifestyle changes. If home treatments do not help, you may need a procedure to shrink or remove the hemorrhoids. What are the causes? Hemorrhoids are caused by pressure near the anus. This pressure may be caused by: Constipation or diarrhea. Straining to poop. Pregnancy. Obesity. Sitting or riding a bike for a long time. Heavy lifting or other things that cause you to strain. Anal sex. What are the signs or symptoms? Symptoms of this condition include: Pain. Anal itching or irritation. Bleeding from the rectum. Leakage of poop (stool). Swelling of the anus. One or more lumps around the anus. How is this diagnosed? Hemorrhoids can often be diagnosed through a visual exam. Other exams or tests may also be done, such as: A digital rectal exam. This is when your health care provider feels inside your rectum with a gloved finger. Anoscope. This is an exam of the anus using a small tube. A blood test, if you have lost a lot of blood. A sigmoidoscopy or colonoscopy. These are tests to look inside the colon using a tube with a camera on the end. How is this treated? In most cases, hemorrhoids can be treated at home with diet and lifestyle changes. If these changes do not help, you may need to have a procedure done. These procedures can make the hemorrhoids smaller or fully remove them. Common procedures include: Rubber band ligation. Rubber bands are placed at the base of the hemorrhoids to cut off their blood  supply. Sclerotherapy. Medicine is put into the hemorrhoids to shrink them. Infrared coagulation. A type of light energy is used to get rid of the hemorrhoids. Hemorrhoidectomy surgery. The hemorrhoids are removed during surgery. Then, the veins that supply them are tied off. Stapled hemorrhoidopexy surgery. The base of the hemorrhoid is stapled to the wall of the rectum. Follow these instructions at home: Medicines Take over-the-counter and prescription medicines only as told by your provider. Use medicated creams or medicines that are put in the rectum (suppositories) as told by your provider. Eating and drinking  Eat foods that are high in fiber, such as beans, whole grains, and fresh fruits and vegetables. Ask your provider about taking products that have fiber added to them (fiber supplements). Reduce the amount of fat in your diet. You can do this by eating low-fat dairy products, eating less red meat, and avoiding processed foods. Drink enough fluid to keep your pee (urine) pale yellow. Managing pain and swelling  Take warm sitz baths for 20 minutes, 3-4 times a day. This can help ease pain and discomfort. You may do this in a bathtub or you can use a portable sitz bath that fits over the toilet. If told, put ice on the affected area. It may help to use ice packs between sitz baths. Put ice in a plastic bag. Place a towel between your skin and the bag. Leave the ice on for 20 minutes, 2-3 times a day. If your skin turns bright red, remove the ice right away to prevent   skin damage. The risk of damage is higher if you cannot feel pain, heat, or cold. General instructions Exercise. Ask your provider how much and what kind of exercise is best for you. In general, you should do moderate exercise for at least 30 minutes on most days of the week (150 minutes each week). You may want to try walking, biking, or yoga. Go to the bathroom when you have the urge to poop. Do not wait. Avoid  straining to poop. Keep the anus dry and clean. Use wet toilet paper or moist towelettes after you poop. Do not sit on the toilet for a long time. This can increase blood pooling and pain. Where to find more information National Institute of Diabetes and Digestive and Kidney Diseases: niddk.nih.gov Contact a health care provider if: You have more pain and swelling that do not get better with treatment. You have trouble pooping or you are not able to poop. You have pain or inflammation outside the area of the hemorrhoids. Get help right away if: You are bleeding from your rectum and you cannot get it to stop. This information is not intended to replace advice given to you by your health care provider. Make sure you discuss any questions you have with your health care provider. Document Revised: 07/17/2022 Document Reviewed: 07/17/2022 Elsevier Patient Education  2024 Elsevier Inc.  

## 2023-04-22 NOTE — Progress Notes (Signed)
Jeri Cos [place],acting as a Neurosurgeon for Arnette Felts, FNP.,have documented all relevant documentation on the behalf of Arnette Felts, FNP,as directed by  Arnette Felts, FNP while in the presence of Arnette Felts, FNP.   Subjective:     Patient ID: Donna Ayers , female    DOB: June 18, 1961 , 62 y.o.   MRN: [id number]   Chief Complaint  Patient presents with   Weight Loss   rectum pain    HPI  Patient presents today for rapid weight loss. She feels like she lost 10-15 pounds since April. She felt like her clothes were getting really big on her. She reported her scale at home said 135-138 pounds. Since the early May. She was exercising with walking 4 days a week. She is eating a healthy diet.   She also reports having rectum pain. The pain started in may she reports she notices pain after having a BM. She stated she is doing sitz baths and using hemorrhoid wipes. Denies blood in stool. She does describe the stool as being longer than usual.   Wt Readings from Last 3 Encounters: 04/22/23 : 149 lb (67.6 kg) 01/02/23 : 150 lb 6.4 oz (68.2 kg) 12/17/21 : 144 lb 9.6 oz (65.6 kg)       Past Medical History:  Diagnosis Date   Leukopenia      Family History  Problem Relation Age of Onset   Diabetes Mother    Hypertension Father    Breast cancer Neg Hx      Current Outpatient Medications:    Cholecalciferol (VITAMIN D3) 50 MCG (2000 UT) capsule, Take 2,000 Units by mouth daily., Disp: , Rfl:    diclofenac (VOLTAREN) 75 MG EC tablet, Take 1 tablet (75 mg total) by mouth 2 (two) times daily., Disp: 30 tablet, Rfl: 2   diclofenac Sodium (VOLTAREN) 1 % GEL, Apply 2 g topically 4 (four) times daily., Disp: 100 g, Rfl: 2   Fluocinolone Acetonide Scalp 0.01 % OIL, APPLY TO SCALP AFTER SHAMPOO, Disp: 118.28 mL, Rfl: 1   hydrocortisone (ANUSOL-HC) 25 MG suppository, Place 1 suppository (25 mg total) rectally 2 (two) times daily as needed for hemorrhoids or anal itching., Disp: 12  suppository, Rfl: 1   Multiple Vitamins-Minerals (MULTIVITAL) CHEW, Chew by mouth. 2 gummies 2 times per day, Disp: , Rfl:    psyllium (METAMUCIL) 58.6 % powder, Take 1 packet by mouth in the morning and at bedtime., Disp: , Rfl:    Allergies  Allergen Reactions   Tramadol Other (See Comments)    vomiting     Review of Systems  Constitutional:  Positive for unexpected weight change. Negative for fatigue.  Respiratory: Negative.  Negative for cough, shortness of breath and wheezing.   Cardiovascular:  Negative for chest pain, palpitations and leg swelling.  Gastrointestinal:  Positive for rectal pain. Negative for abdominal pain, nausea and vomiting.  Neurological: Negative.  Negative for dizziness and headaches.  Psychiatric/Behavioral: Negative.       Today's Vitals   04/22/23 1026  BP: 110/80  Pulse: 75  Temp: 98.1 F (36.7 C)  Weight: 149 lb (67.6 kg)  Height: 5\' 8"  (1.727 m)  PainSc: 2    Body mass index is 22.66 kg/m.  The 10-year ASCVD risk score (Arnett DK, et al., 2019) is: 2.6%   Values used to calculate the score:     Age: 89 years     Sex: Female     Is Non-Hispanic African American: Yes  Diabetic: No     Tobacco smoker: No     Systolic Blood Pressure: 110 mmHg     Is BP treated: No     HDL Cholesterol: 77 mg/dL     Total Cholesterol: 186 mg/dL  Objective:  Physical Exam Vitals reviewed.  Constitutional:      General: She is not in acute distress.    Appearance: Normal appearance. She is well-developed.  Cardiovascular:     Rate and Rhythm: Normal rate and regular rhythm.     Pulses: Normal pulses.     Heart sounds: Normal heart sounds. No murmur heard.    No S3 or S4 sounds.  Pulmonary:     Effort: Pulmonary effort is normal. No respiratory distress.     Breath sounds: Normal breath sounds. No wheezing.  Musculoskeletal:        General: No swelling or tenderness.     Right lower leg: No tenderness. No edema.     Left lower leg: No tenderness.  No edema.  Skin:    General: Skin is warm and dry.     Capillary Refill: Capillary refill takes less than 2 seconds.  Neurological:     Mental Status: She is alert.  Psychiatric:        Mood and Affect: Mood normal.        Behavior: Behavior normal.        Thought Content: Thought content normal.        Judgment: Judgment normal.         Assessment And Plan:     1. Weight loss, unintentional Comments: Weight in office is only one pound less, she is to get a new scale. Feeling of weight loss may also be related to her losing inches. - CBC with Differential/Platelet  2. Grade I hemorrhoids Comments: Small hemorrhoid felt to posterior right internal anus. Will treat with Anusol. Encouraged to eat a high fiber diet. - hydrocortisone (ANUSOL-HC) 25 MG suppository; Place 1 suppository (25 mg total) rectally 2 (two) times daily as needed for hemorrhoids or anal itching.  Dispense: 12 suppository; Refill: 1  3. BMI 22.0-22.9, adult  4. Herpes zoster vaccination declined Declines shingrix, educated on disease process and is aware if he changes his mind to notify office   Return if symptoms worsen or fail to improve.  Patient was given opportunity to ask questions. Patient verbalized understanding of the plan and was able to repeat key elements of the plan. All questions were answered to their satisfaction.  Arnette Felts, FNP   I, Arnette Felts, FNP, have reviewed all documentation for this visit. The documentation on 04/22/23 for the exam, diagnosis, procedures, and orders are all accurate and complete.   IF YOU HAVE BEEN REFERRED TO A SPECIALIST, IT MAY TAKE 1-2 WEEKS TO SCHEDULE/PROCESS THE REFERRAL. IF YOU HAVE NOT HEARD FROM US/SPECIALIST IN TWO WEEKS, PLEASE GIVE [place] A CALL AT [phone].   THE PATIENT IS ENCOURAGED TO PRACTICE SOCIAL DISTANCING DUE TO THE COVID-19 PANDEMIC.

## 2023-08-18 ENCOUNTER — Other Ambulatory Visit: Payer: Self-pay | Admitting: Internal Medicine

## 2023-08-18 DIAGNOSIS — Z1231 Encounter for screening mammogram for malignant neoplasm of breast: Secondary | ICD-10-CM

## 2023-09-19 DIAGNOSIS — H524 Presbyopia: Secondary | ICD-10-CM | POA: Diagnosis not present

## 2023-10-07 ENCOUNTER — Ambulatory Visit: Payer: Commercial Managed Care - PPO

## 2023-11-04 ENCOUNTER — Ambulatory Visit: Payer: Commercial Managed Care - PPO

## 2023-11-04 ENCOUNTER — Ambulatory Visit
Admission: RE | Admit: 2023-11-04 | Discharge: 2023-11-04 | Disposition: A | Discharge status: Home / Self Care | Payer: Commercial Managed Care - PPO | Source: Ambulatory Visit | Attending: Internal Medicine | Admitting: Internal Medicine

## 2023-11-04 DIAGNOSIS — Z1231 Encounter for screening mammogram for malignant neoplasm of breast: Secondary | ICD-10-CM | POA: Diagnosis not present

## 2024-01-15 ENCOUNTER — Ambulatory Visit (INDEPENDENT_AMBULATORY_CARE_PROVIDER_SITE_OTHER): Payer: Commercial Managed Care - PPO | Admitting: Internal Medicine

## 2024-01-15 ENCOUNTER — Encounter: Payer: Self-pay | Admitting: Internal Medicine

## 2024-01-15 ENCOUNTER — Other Ambulatory Visit (HOSPITAL_COMMUNITY): Payer: Self-pay

## 2024-01-15 VITALS — BP 120/74 | HR 80 | Temp 98.5°F | Ht 68.0 in | Wt 158.4 lb

## 2024-01-15 DIAGNOSIS — Z0000 Encounter for general adult medical examination without abnormal findings: Secondary | ICD-10-CM

## 2024-01-15 DIAGNOSIS — E559 Vitamin D deficiency, unspecified: Secondary | ICD-10-CM | POA: Diagnosis not present

## 2024-01-15 DIAGNOSIS — Z823 Family history of stroke: Secondary | ICD-10-CM | POA: Diagnosis not present

## 2024-01-15 DIAGNOSIS — M1712 Unilateral primary osteoarthritis, left knee: Secondary | ICD-10-CM | POA: Diagnosis not present

## 2024-01-15 MED ORDER — MELOXICAM 7.5 MG PO TABS
7.5000 mg | ORAL_TABLET | Freq: Every day | ORAL | 0 refills | Status: AC | PRN
  Filled 2024-01-15: qty 30, 30d supply, fill #0

## 2024-01-15 NOTE — Assessment & Plan Note (Addendum)
 Previous Ortho notes reviewed, she had good results with steroid injection; however, she does not wish to go back. She had a rash d/t diclofenac.  She feels that Aleve and ibuprofen are ineffective. Will try meloxicam prn. She is aware she may have an allergic reaction.

## 2024-01-15 NOTE — Assessment & Plan Note (Signed)

## 2024-01-15 NOTE — Patient Instructions (Addendum)
 Ginger/turmeric tea Dr. [address] [phone]  Health Maintenance, Female Adopting a healthy lifestyle and getting preventive care are important in promoting health and wellness. Ask your health care provider about: The right schedule for you to have regular tests and exams. Things you can do on your own to prevent diseases and keep yourself healthy. What should I know about diet, weight, and exercise? Eat a healthy diet  Eat a diet that includes plenty of vegetables, fruits, low-fat dairy products, and lean protein. Do not eat a lot of foods that are high in solid fats, added sugars, or sodium. Maintain a healthy weight Body mass index (BMI) is used to identify weight problems. It estimates body fat based on height and weight. Your health care provider can help determine your BMI and help you achieve or maintain a healthy weight. Get regular exercise Get regular exercise. This is one of the most important things you can do for your health. Most adults should: Exercise for at least 150 minutes each week. The exercise should increase your heart rate and make you sweat (moderate-intensity exercise). Do strengthening exercises at least twice a week. This is in addition to the moderate-intensity exercise. Spend less time sitting. Even light physical activity can be beneficial. Watch cholesterol and blood lipids Have your blood tested for lipids and cholesterol at 61 years of age, then have this test every 5 years. Have your cholesterol levels checked more often if: Your lipid or cholesterol levels are high. You are older than 61 years of age. You are at high risk for heart disease. What should I know about cancer screening? Depending on your health history and family history, you may need to have cancer screening at various ages. This may include screening for: Breast cancer. Cervical cancer. Colorectal cancer. Skin cancer. Lung cancer. What should I know about heart disease, diabetes, and  high blood pressure? Blood pressure and heart disease High blood pressure causes heart disease and increases the risk of stroke. This is more likely to develop in people who have high blood pressure readings or are overweight. Have your blood pressure checked: Every 3-5 years if you are 46-35 years of age. Every year if you are 73 years old or older. Diabetes Have regular diabetes screenings. This checks your fasting blood sugar level. Have the screening done: Once every three years after age 38 if you are at a normal weight and have a low risk for diabetes. More often and at a younger age if you are overweight or have a high risk for diabetes. What should I know about preventing infection? Hepatitis B If you have a higher risk for hepatitis B, you should be screened for this virus. Talk with your health care provider to find out if you are at risk for hepatitis B infection. Hepatitis C Testing is recommended for: Everyone born from [birth year] through 1963. Anyone with known risk factors for hepatitis C. Sexually transmitted infections (STIs) Get screened for STIs, including gonorrhea and chlamydia, if: You are sexually active and are younger than 61 years of age. You are older than 61 years of age and your health care provider tells you that you are at risk for this type of infection. Your sexual activity has changed since you were last screened, and you are at increased risk for chlamydia or gonorrhea. Ask your health care provider if you are at risk. Ask your health care provider about whether you are at high risk for HIV. Your health care provider may recommend  a prescription medicine to help prevent HIV infection. If you choose to take medicine to prevent HIV, you should first get tested for HIV. You should then be tested every 3 months for as long as you are taking the medicine. Pregnancy If you are about to stop having your period (premenopausal) and you may become pregnant, seek counseling  before you get pregnant. Take 400 to 800 micrograms (mcg) of folic acid every day if you become pregnant. Ask for birth control (contraception) if you want to prevent pregnancy. Osteoporosis and menopause Osteoporosis is a disease in which the bones lose minerals and strength with aging. This can result in bone fractures. If you are 61 years old or older, or if you are at risk for osteoporosis and fractures, ask your health care provider if you should: Be screened for bone loss. Take a calcium or vitamin D supplement to lower your risk of fractures. Be given hormone replacement therapy (HRT) to treat symptoms of menopause. Follow these instructions at home: Alcohol use Do not drink alcohol if: Your health care provider tells you not to drink. You are pregnant, may be pregnant, or are planning to become pregnant. If you drink alcohol: Limit how much you have to: 0-1 drink a day. Know how much alcohol is in your drink. In the [place] one drink equals one 12 oz bottle of beer (355 mL), one 5 oz glass of wine (148 mL), or one 1 oz glass of hard liquor (44 mL). Lifestyle Do not use any products that contain nicotine or tobacco. These products include cigarettes, chewing tobacco, and vaping devices, such as e-cigarettes. If you need help quitting, ask your health care provider. Do not use street drugs. Do not share needles. Ask your health care provider for help if you need support or information about quitting drugs. General instructions Schedule regular health, dental, and eye exams. Stay current with your vaccines. Tell your health care provider if: You often feel depressed. You have ever been abused or do not feel safe at home. Summary Adopting a healthy lifestyle and getting preventive care are important in promoting health and wellness. Follow your health care provider's instructions about healthy diet, exercising, and getting tested or screened for diseases. Follow your health care  provider's instructions on monitoring your cholesterol and blood pressure. This information is not intended to replace advice given to you by your health care provider. Make sure you discuss any questions you have with your health care provider. Document Revised: 03/26/2021 Document Reviewed: 03/26/2021 Elsevier Patient Education  2024 [employer].

## 2024-01-15 NOTE — Assessment & Plan Note (Signed)
 I WILL CHECK A VIT D LEVEL AND SUPPLEMENT AS NEEDED.  ALSO ENCOURAGED TO SPEND 15 MINUTES IN THE SUN DAILY.

## 2024-01-15 NOTE — Progress Notes (Signed)
 I,Donna Ayers, CMA,acting as a Neurosurgeon for Donna Aliment, MD.,have documented all relevant documentation on the behalf of Donna Aliment, MD,as directed by  Donna Aliment, MD while in the presence of Donna Aliment, MD.  Subjective:    Patient ID: Donna Ayers , female    DOB: [birth year] , 63 y.o.   MRN: [id number]  Chief Complaint  Patient presents with   Annual Exam    HPI  She is here today for a full physical exam. She is not followed by GYN. She exercises regularly and follows a clean eating plan. She does admit to drinking diet drinks and using Splenda.        Past Medical History:  Diagnosis Date   Leukopenia      Family History  Problem Relation Age of Onset   Diabetes Mother    Hypertension Father    Breast cancer Neg Hx    BRCA 1/2 Neg Hx      Current Outpatient Medications:    Cholecalciferol (VITAMIN D3) 50 MCG (2000 UT) capsule, Take 2,000 Units by mouth daily., Disp: , Rfl:    diclofenac (VOLTAREN) 75 MG EC tablet, Take 1 tablet (75 mg total) by mouth 2 (two) times daily., Disp: 30 tablet, Rfl: 2   diclofenac Sodium (VOLTAREN) 1 % GEL, Apply 2 g topically 4 (four) times daily., Disp: 100 g, Rfl: 2   Fluocinolone Acetonide Scalp 0.01 % OIL, APPLY TO SCALP AFTER SHAMPOO, Disp: 118.28 mL, Rfl: 1   hydrocortisone (ANUSOL-HC) 25 MG suppository, Place 1 suppository (25 mg total) rectally 2 (two) times daily as needed for hemorrhoids or anal itching., Disp: 12 suppository, Rfl: 1   meloxicam (MOBIC) 7.5 MG tablet, Take 1 tablet (7.5 mg total) by mouth daily as needed for pain., Disp: 30 tablet, Rfl: 0   Multiple Vitamins-Minerals (MULTIVITAL) CHEW, Chew by mouth. 2 gummies 2 times per day, Disp: , Rfl:    psyllium (METAMUCIL) 58.6 % powder, Take 1 packet by mouth in the morning and at bedtime., Disp: , Rfl:    Allergies  Allergen Reactions   Tramadol Other (See Comments)    vomiting   Diclofenac Sodium Rash      The patient states she uses post  menopausal status for birth control. No LMP recorded. Patient is postmenopausal.. Negative for Menorrhagia. Negative for: breast discharge, breast lump(s), breast pain and breast self exam. Associated symptoms include abnormal vaginal bleeding. Pertinent negatives include abnormal bleeding (hematology), anxiety, decreased libido, depression, difficulty falling sleep, dyspareunia, history of infertility, nocturia, sexual dysfunction, sleep disturbances, urinary incontinence, urinary urgency, vaginal discharge and vaginal itching. Diet regular.The patient states her exercise level is  moderate.   . The patient's tobacco use is:  Social History   Tobacco Use  Smoking Status Never  Smokeless Tobacco Never  . She has been exposed to passive smoke. The patient's alcohol use is:  Social History   Substance and Sexual Activity  Alcohol Use Never    Review of Systems  Constitutional: Negative.   HENT: Negative.    Eyes: Negative.   Respiratory: Negative.    Cardiovascular: Negative.   Gastrointestinal: Negative.   Endocrine: Negative.   Genitourinary: Negative.   Musculoskeletal:  Positive for arthralgias.       She c/o left knee pain. She has been seen by Ortho in the past, was given injection - did well.  Her sx have recurred.  Also tried diclofenac; however, it caused a rash.  The pain  is intermittent, she exercises through the pain; however, it has become a lot more bothersome.  She admits she does a lot of walking at work.   Skin: Negative.   Allergic/Immunologic: Negative.   Neurological: Negative.   Hematological: Negative.   Psychiatric/Behavioral: Negative.       Today's Vitals   01/15/24 1045  BP: 120/74  Pulse: 80  Temp: 98.5 F (36.9 C)  SpO2: 98%  Weight: 158 lb 6.4 oz (71.8 kg)  Height: 5\' 8"  (1.727 m)   Body mass index is 24.08 kg/m.  Wt Readings from Last 3 Encounters:  01/15/24 158 lb 6.4 oz (71.8 kg)  04/22/23 149 lb (67.6 kg)  01/02/23 150 lb 6.4 oz (68.2 kg)     The 10-year ASCVD risk score (Arnett DK, et al., 2019) is: 3.7%   Values used to calculate the score:     Age: 3 years     Sex: Female     Is Non-Hispanic African American: Yes     Diabetic: No     Tobacco smoker: No     Systolic Blood Pressure: 120 mmHg     Is BP treated: No     HDL Cholesterol: 77 mg/dL     Total Cholesterol: 186 mg/dL   Objective:  Physical Exam Vitals and nursing note reviewed.  Constitutional:      General: She is not in acute distress.    Appearance: Normal appearance. She is well-developed. She is obese.  HENT:     Head: Normocephalic and atraumatic.     Right Ear: Hearing, tympanic membrane, ear canal and external ear normal. There is no impacted cerumen.     Left Ear: Hearing, tympanic membrane, ear canal and external ear normal. There is no impacted cerumen.     Nose:     Comments: Deferred - masked    Mouth/Throat:     Comments: Deferred - masked Eyes:     General: Lids are normal.     Extraocular Movements: Extraocular movements intact.     Conjunctiva/sclera: Conjunctivae normal.     Pupils: Pupils are equal, round, and reactive to light.     Funduscopic exam:    Right eye: No papilledema.        Left eye: No papilledema.  Neck:     Thyroid: No thyroid mass.     Vascular: No carotid bruit.  Cardiovascular:     Rate and Rhythm: Normal rate and regular rhythm.     Pulses: Normal pulses.          Dorsalis pedis pulses are 2+ on the right side and 2+ on the left side.     Heart sounds: Normal heart sounds. No murmur heard. Pulmonary:     Effort: Pulmonary effort is normal.     Breath sounds: Normal breath sounds.  Chest:     Chest wall: No mass.  Breasts:    Tanner Score is 5.     Right: Normal. No mass or tenderness.     Left: Normal. No mass or tenderness.  Abdominal:     General: Abdomen is flat. Bowel sounds are normal. There is no distension.     Palpations: Abdomen is soft.     Tenderness: There is no abdominal tenderness.   Genitourinary:    Comments: Deferred  Musculoskeletal:        General: No swelling. Normal range of motion.     Cervical back: Full passive range of motion without pain, normal range of motion  and neck supple.     Right lower leg: No edema.     Left lower leg: No edema.     Comments: B/l crepitus L>R  Feet:     Comments: Nl capillary refill Lymphadenopathy:     Upper Body:     Right upper body: No supraclavicular, axillary or pectoral adenopathy.     Left upper body: No supraclavicular, axillary or pectoral adenopathy.  Skin:    General: Skin is warm and dry.     Capillary Refill: Capillary refill takes less than 2 seconds.  Neurological:     General: No focal deficit present.     Mental Status: She is alert and oriented to person, place, and time.     Cranial Nerves: No cranial nerve deficit.     Sensory: No sensory deficit.  Psychiatric:        Mood and Affect: Mood normal.        Behavior: Behavior normal.        Thought Content: Thought content normal.        Judgment: Judgment normal.         Assessment And Plan:     Encounter for general adult medical examination w/o abnormal findings Assessment & Plan: A full exam was performed.  Importance of monthly self breast exams was discussed with the patient.  She is advised to get 30-45 minutes of regular exercise, no less than four to five days per week. Both weight-bearing and aerobic exercises are recommended.  She is advised to follow a healthy diet with at least six fruits/veggies per day, decrease intake of red meat and other saturated fats and to increase fish intake to twice weekly.  Meats/fish should not be fried -- baked, boiled or broiled is preferable. It is also important to cut back on your sugar intake.  Be sure to read labels - try to avoid anything with added sugar, high fructose corn syrup or other sweeteners.  If you must use a sweetener, you can try stevia or monkfruit.  It is also important to avoid  artificially sweetened foods/beverages and diet drinks. Lastly, wear SPF 50 sunscreen on exposed skin and when in direct sunlight for an extended period of time.  Be sure to avoid fast food restaurants and aim for at least 60 ounces of water daily.      Orders: -     CBC -     CMP14+EGFR -     Lipid panel -     TSH  Localized osteoarthritis of left knee Assessment & Plan: Previous Ortho notes reviewed, she had good results with steroid injection; however, she does not wish to go back. She had a rash d/t diclofenac.  She feels that Aleve and ibuprofen are ineffective. Will try meloxicam prn. She is aware she may have an allergic reaction.    Vitamin D insufficiency Assessment & Plan: I WILL CHECK A VIT D LEVEL AND SUPPLEMENT AS NEEDED.  ALSO ENCOURAGED TO SPEND 15 MINUTES IN THE SUN DAILY.   Orders: -     VITAMIN D 25 Hydroxy (Vit-D Deficiency, Fractures)  Family history of stroke -     Lipoprotein A (LPA)  Other orders -     Meloxicam; Take 1 tablet (7.5 mg total) by mouth daily as needed for pain.  Dispense: 30 tablet; Refill: 0     Return for 1 year HM . Patient was given opportunity to ask questions. Patient verbalized understanding of the plan and was able  to repeat key elements of the plan. All questions were answered to their satisfaction.    I, Donna Aliment, MD, have reviewed all documentation for this visit. The documentation on 01/15/24 for the exam, diagnosis, procedures, and orders are all accurate and complete.

## 2024-01-17 LAB — CMP14+EGFR
ALT: 15 IU/L (ref 0–32)
AST: 23 IU/L (ref 0–40)
Albumin: 4.4 g/dL (ref 3.9–4.9)
Alkaline Phosphatase: 107 IU/L (ref 44–121)
BUN/Creatinine Ratio: 15 (ref 12–28)
BUN: 12 mg/dL (ref 8–27)
Bilirubin Total: 0.4 mg/dL (ref 0.0–1.2)
CO2: 24 mmol/L (ref 20–29)
Calcium: 9.7 mg/dL (ref 8.7–10.3)
Chloride: 102 mmol/L (ref 96–106)
Creatinine, Ser: 0.79 mg/dL (ref 0.57–1.00)
Globulin, Total: 3 g/dL (ref 1.5–4.5)
Glucose: 74 mg/dL (ref 70–99)
Potassium: 4.7 mmol/L (ref 3.5–5.2)
Sodium: 141 mmol/L (ref 134–144)
Total Protein: 7.4 g/dL (ref 6.0–8.5)
eGFR: 85 mL/min/{1.73_m2} (ref >59)

## 2024-01-17 LAB — LIPID PANEL
Chol/HDL Ratio: 2.4 ratio (ref 0.0–4.4)
Cholesterol, Total: 180 mg/dL (ref 100–199)
HDL: 76 mg/dL (ref >39)
LDL Chol Calc (NIH): 94 mg/dL (ref 0–99)
Triglycerides: 52 mg/dL (ref 0–149)
VLDL Cholesterol Cal: 10 mg/dL (ref 5–40)

## 2024-01-17 LAB — CBC
Hematocrit: 41.9 % (ref 34.0–46.6)
Hemoglobin: 13.6 g/dL (ref 11.1–15.9)
MCH: 29.9 pg (ref 26.6–33.0)
MCHC: 32.5 g/dL (ref 31.5–35.7)
MCV: 92 fL (ref 79–97)
Platelets: 234 10*3/uL (ref 150–450)
RBC: 4.55 x10E6/uL (ref 3.77–5.28)
RDW: 12.1 % (ref 11.7–15.4)
WBC: 3.7 10*3/uL (ref 3.4–10.8)

## 2024-01-17 LAB — VITAMIN D 25 HYDROXY (VIT D DEFICIENCY, FRACTURES): Vit D, 25-Hydroxy: 61.2 ng/mL (ref 30.0–100.0)

## 2024-01-17 LAB — TSH: TSH: 1 u[IU]/mL (ref 0.450–4.500)

## 2024-01-17 LAB — LIPOPROTEIN A (LPA): Lipoprotein (a): 61.2 nmol/L (ref <75.0)

## 2024-08-18 ENCOUNTER — Other Ambulatory Visit: Payer: Self-pay | Admitting: Nurse Practitioner

## 2024-08-18 ENCOUNTER — Other Ambulatory Visit: Payer: Self-pay | Admitting: Internal Medicine

## 2024-08-18 ENCOUNTER — Other Ambulatory Visit (HOSPITAL_COMMUNITY): Payer: Self-pay

## 2024-08-18 DIAGNOSIS — K640 First degree hemorrhoids: Secondary | ICD-10-CM

## 2024-08-19 ENCOUNTER — Other Ambulatory Visit (HOSPITAL_COMMUNITY): Payer: Self-pay

## 2024-08-19 ENCOUNTER — Other Ambulatory Visit: Payer: Self-pay

## 2024-08-19 MED ORDER — HYDROCORTISONE ACETATE 25 MG RE SUPP
25.0000 mg | Freq: Two times a day (BID) | RECTAL | 1 refills | Status: AC | PRN
  Filled 2024-08-19: qty 12, 6d supply, fill #0

## 2024-08-19 MED ORDER — FLUOCINOLONE ACETONIDE SCALP 0.01 % EX OIL
1.0000 | TOPICAL_OIL | Freq: Every day | CUTANEOUS | 1 refills | Status: AC
  Filled 2024-08-19: qty 118.28, 30d supply, fill #0

## 2024-09-21 ENCOUNTER — Other Ambulatory Visit: Payer: Self-pay | Admitting: Internal Medicine

## 2024-09-21 DIAGNOSIS — Z1231 Encounter for screening mammogram for malignant neoplasm of breast: Secondary | ICD-10-CM

## 2024-10-19 ENCOUNTER — Other Ambulatory Visit (HOSPITAL_COMMUNITY): Payer: Self-pay

## 2024-10-19 DIAGNOSIS — Z1211 Encounter for screening for malignant neoplasm of colon: Secondary | ICD-10-CM | POA: Diagnosis not present

## 2024-10-19 MED ORDER — GOLYTELY 236 G PO SOLR
ORAL | 0 refills | Status: AC
  Filled 2024-10-19: qty 4000, 2d supply, fill #0

## 2024-11-01 DIAGNOSIS — Z1211 Encounter for screening for malignant neoplasm of colon: Secondary | ICD-10-CM | POA: Diagnosis not present

## 2024-11-01 LAB — HM COLONOSCOPY

## 2024-11-16 ENCOUNTER — Ambulatory Visit
Admission: RE | Admit: 2024-11-16 | Discharge: 2024-11-16 | Disposition: A | Discharge status: Home / Self Care | Source: Ambulatory Visit | Attending: Internal Medicine | Admitting: Internal Medicine

## 2024-11-16 DIAGNOSIS — Z1231 Encounter for screening mammogram for malignant neoplasm of breast: Secondary | ICD-10-CM

## 2024-11-20 LAB — COLOGUARD: Cologuard: NEGATIVE

## 2025-01-19 ENCOUNTER — Encounter: Payer: Commercial Managed Care - PPO | Admitting: Internal Medicine
